# Patient Record
Sex: Female | Born: 1975 | Race: White | Hispanic: No
Health system: Western US, Academic
[De-identification: ages and names within clinical notes are randomized; demographics above are authoritative.]

## PROBLEM LIST (undated history)

## (undated) DIAGNOSIS — R0602 Shortness of breath: Secondary | ICD-10-CM

## (undated) DIAGNOSIS — J989 Respiratory disorder, unspecified: Secondary | ICD-10-CM

## (undated) DIAGNOSIS — K259 Gastric ulcer, unspecified as acute or chronic, without hemorrhage or perforation: Secondary | ICD-10-CM

## (undated) DIAGNOSIS — N289 Disorder of kidney and ureter, unspecified: Secondary | ICD-10-CM

## (undated) DIAGNOSIS — M1990 Unspecified osteoarthritis, unspecified site: Secondary | ICD-10-CM

## (undated) DIAGNOSIS — I10 Essential (primary) hypertension: Secondary | ICD-10-CM

## (undated) DIAGNOSIS — F32A Depression, unspecified: Secondary | ICD-10-CM

## (undated) DIAGNOSIS — J45909 Unspecified asthma, uncomplicated: Secondary | ICD-10-CM

## (undated) DIAGNOSIS — D649 Anemia, unspecified: Secondary | ICD-10-CM

## (undated) HISTORY — DX: Depression, unspecified: F32.A

## (undated) HISTORY — DX: Respiratory disorder, unspecified: J98.9

## (undated) HISTORY — DX: Essential (primary) hypertension: I10

## (undated) HISTORY — DX: Disorder of kidney and ureter, unspecified: N28.9

## (undated) HISTORY — DX: Unspecified asthma, uncomplicated: J45.909

## (undated) HISTORY — DX: Shortness of breath: R06.02

## (undated) HISTORY — DX: Anemia, unspecified: D64.9

## (undated) HISTORY — DX: Unspecified osteoarthritis, unspecified site: M19.90

## (undated) HISTORY — DX: Gastric ulcer, unspecified as acute or chronic, without hemorrhage or perforation: K25.9

---

## 2005-12-12 ENCOUNTER — Encounter: Payer: MEDICAID | Admitting: Endocrinology

## 2006-01-16 ENCOUNTER — Ambulatory Visit: Payer: MEDICAID

## 2006-02-13 ENCOUNTER — Encounter: Payer: MEDICAID | Admitting: Otolaryngology

## 2006-02-18 ENCOUNTER — Encounter (HOSPITAL_COMMUNITY): Payer: MEDICAID

## 2006-02-18 ENCOUNTER — Other Ambulatory Visit (HOSPITAL_BASED_OUTPATIENT_CLINIC_OR_DEPARTMENT_OTHER): Payer: Self-pay

## 2006-02-26 ENCOUNTER — Encounter: Payer: MEDICAID | Admitting: Otolaryngology

## 2006-02-27 LAB — PATHOLOGY, SURGICAL

## 2016-03-22 ENCOUNTER — Telehealth (INDEPENDENT_AMBULATORY_CARE_PROVIDER_SITE_OTHER): Payer: Self-pay | Admitting: Oral and Maxillofacial Surgery

## 2016-03-22 NOTE — Telephone Encounter (Signed)
Patient was warmly transferred requesting explanation why she is being referred to the [facility]. She looked at the web site of the aforementioned clinic and she has the impression that this clinic will have nothing to do with her request for dental extractions. Informed patient that this is a recommendation from our clinic upon review of her medical history. Informed patient that I will send a message to our clinic RN Manager to call her back  on Tuesday-03/26/16. Patient hang up with an unhappy note and stated that she will call back on Tuesday as well.

## 2016-05-16 ENCOUNTER — Telehealth (INDEPENDENT_AMBULATORY_CARE_PROVIDER_SITE_OTHER): Payer: Self-pay | Admitting: Oral & Maxillofacial Surgery

## 2016-05-16 NOTE — Telephone Encounter (Signed)
VM is full unable to leave message  Need to verify insurance if Medicare only we cannot see for extractions    Transfer patient to clinic    RedfieldMichelle  [phone]

## 2016-06-04 ENCOUNTER — Telehealth (INDEPENDENT_AMBULATORY_CARE_PROVIDER_SITE_OTHER): Payer: Self-pay | Admitting: Oral & Maxillofacial Surgery

## 2016-06-04 NOTE — Telephone Encounter (Signed)
(  TEXTING IS AN OPTION FOR [facility] ONLY)  Is this a [facility] clinic? No      RETURN CALL: Detailed message on voicemail only      SUBJECT:  General Message     REASON FOR REQUEST: returning call     MESSAGE: Patient is calling because she received a letter stating her appointment for 08/22 was cancelled and she would like to know why and get the appointment rescheduled as soon as possible. Please contact to discuss. Thank you!

## 2016-06-18 ENCOUNTER — Encounter (INDEPENDENT_AMBULATORY_CARE_PROVIDER_SITE_OTHER): Payer: Self-pay | Admitting: Oral & Maxillofacial Surgery

## 2016-08-27 ENCOUNTER — Encounter (INDEPENDENT_AMBULATORY_CARE_PROVIDER_SITE_OTHER): Payer: Self-pay | Admitting: Oral & Maxillofacial Surgery

## 2016-08-27 ENCOUNTER — Ambulatory Visit (INDEPENDENT_AMBULATORY_CARE_PROVIDER_SITE_OTHER): Payer: Medicare Other | Admitting: Oral & Maxillofacial Surgery

## 2016-08-27 VITALS — BP 118/74 | HR 79 | Temp 97.9°F | Resp 18 | Ht 66.0 in | Wt 145.0 lb

## 2016-08-27 DIAGNOSIS — Z6823 Body mass index (BMI) 23.0-23.9, adult: Secondary | ICD-10-CM

## 2016-08-27 DIAGNOSIS — K029 Dental caries, unspecified: Secondary | ICD-10-CM

## 2016-08-27 NOTE — Progress Notes (Addendum)
CC/HPI: 40 year old female with PMH significant for adrenal insufficiency due to surgical transsphenoidal pituitary adenomectomy and bilateral adrenalectomy, referred by Dr. Salvadore FarberAndrew Wightman DMD in [place], [place], presents for consultation regarding extraction of all remaining teeth. The patient reports constant pain associated with her dentition for the past 3 years. She reports that due to her condition, she has trouble brushing her teeth because they crumble when she tries to brush them. She reports that her teeth are brittle and break when she is eating soft food. Pt reports intermittent swelling and infection of her teeth. She desires to have the tooth removed to relieve her from dental pain.    PMH:   Past Medical History:   Diagnosis Date   . Anemia    . Arthritis    . Asthma    . Breath shortness    . Depression    . Gastric ulcer    . Hypertension    . Kidney disease    . 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 Pt elected to have bilateral adrenalectomy rather than repeat trans sphenoidal pituitary removal surgery.   Addison's Disease - under chronic corticosteroid supplemetnation  Asthma- last used emergency inhaler 6 months ago. Triggered by high stress. Never hospitalized  Borderline Anemia - no excessive bleeding noted during prior surgeries  Herniated Discs   Depression  Interstitial cystitis  Fibromyalgia  Hx of bronchitis      PSH:   Transsphenoidal pituitary adenomectomy (2006)  Bilateral adrenalectomy (2010)  Gall bladder removal (2013)  Microdiscectomy (2004)  Appendectomy (2005)  Wisdom teeth removal (as teenager)    MEDS:     Current Outpatient Prescriptions:   .  Fludrocortisone Acetate 0.1 MG Oral Tab, , Disp: , Rfl:   .  Hydrocortisone 20 MG Oral Tab, , Disp: , Rfl:   .  Ondansetron HCl 8 MG Oral Tab, , Disp: , Rfl:   .  OxyCODONE HCl ER 40 MG Oral Tablet Extended Release 12 hour  Abuse-Deterrent, , Disp: , Rfl:   .  Sertraline HCl 100 MG Oral Tab, , Disp: , Rfl:     ALLERGIES:   Review of patient's allergies indicates:  Allergies   Allergen Reactions   . Penicillins Hives     (at 40 yo)   . Tramadol Unknown     "Causes adverse w/drawal similar to opiates"   . Cefdinir      Other reaction(s): Diarrhea  Other reaction(s): Rash       SH:   Pt has 24 pack year history of tobacco usage. Has been using e-cig for past 3 years.  Denies ETOH/illicit drugs    FH:   Non-contributory    PE:   Vital Signs:   Vitals:    08/27/16 1352   BP: 118/74   BP Cuff Size: Regular   BP Site: Left Arm   BP Position: Sitting   Pulse: 79   Resp: 18   Temp: 97.9 F (36.6 C)   TempSrc: Temporal   SpO2: 98%   Weight: 145 lb (65.8 kg)   Height: 5\' 6"  (1.676 m)     GEN: Awake. alert, oriented, NAD  NEURO: Cns VII and V1, V2, V3 grossly intact  HEENT: NCAT. Face is symmetric without facial swelling. Mandibular opening is approximately 35-4840mm. FOM soft. Mallampati 1. OP clear. Tongue is mobile. Generalized gross decay. Multiple fractured teeth. Missing teeth #  1, 16, 17, 32. Space between tooth #12 and 14 with fragmented root tip #13 remaining not visualized on panoramic radiograph. No present abscess or sites of drainage.   CV: RRR  CHEST: CTAB    IMAGING: Panoramic radiograph shows carious remaining teeth including teeth # 2, 3, 4, 5, 6, 7, 8, 9, 10, 11, 12, 13, 14, 15, 18, 19, 20, 21, 22, 23, 24, 25, 26, 27, 28, 29, 30, 31. Missing teeth #1, 16, 17, 32.     ASSESSMENT: 40 year old female with PMH significant for adrenal insufficiency due to surgical transsphenoidal pituitary adenomectomy and bilateral adrenalectomy, referred by Dr. Salvadore FarberAndrew Wightman DMD in [place], [place], indicated for extraction of all remaining teeth to facilitate denture fabciraction.    PLAN:  Plan is to complete full mouth surgical extractions of 28 remaining teeth/ root tips including teeth # 2, 3, 4, 5, 6, 7, 8, 9, 10, 11, 12, 13, 14, 15, 18, 19, 20, 21,  22, 23, 24, 25, 26, 27, 28, 29, 30, 31. Types of anesthesia and sedation were discussed with patient, but patient has not yet made final decision for type of anesthesia.    Due to her chronic corticosteroid use, pt will need stress dose of her steroids.  If patient will have extractions done in clinic under IV sedation or local anesthesia, it would be appropriate to contact patient's endocrinologist for management recommendations.   Endocrinologist: ElenaVarlamovM.D. 503 [phone]    Details of the procedure and potential risks versus benefits of dental extraction were discussed. The patient was made aware of the risks involving but not limited to pain, swelling, infection, excessive bleeding, poor wound healing, nerve injury that can arise from administration of local anesthetics, injury to the adjacent teeth, oroantral communication and formation of periodontal defect. The patient elects to proceed with the extraction. Anesthesia options were discussed. The patient requested a summary of cost estimates for local anesthesia, IV sedation, and general anesthesia. Pt will be called back when cost estimates are available.  Questions were welcomed and answered in detail.       Pt was seen and evaluated by attending Dr. Laurell JosephsBurke, who agrees with this assessment and plan.    Perfecto KingdomLeena Zurayk, DDS  Resident, Oral and Maxillofacial Surgery    I have examined the patient, reviewed her history and findings.  I agree with Dr. Lollie SailsZurayk's note and plan.  We will follow up with the patient regarding her anesthesia options.  As mentioned above, should this be done in our outpatient clinic, and we will contact her endocrinologist for any recommendations regarding her corticosteroid use.    Jackquline BerlinAndrea B. Burke, DMD, MD  Attending, Oral & Maxillofacial Surgery

## 2016-09-09 ENCOUNTER — Telehealth (INDEPENDENT_AMBULATORY_CARE_PROVIDER_SITE_OTHER): Payer: Self-pay | Admitting: Oral & Maxillofacial Surgery

## 2016-09-09 NOTE — Telephone Encounter (Signed)
I have faxed the Estimated Hospital Fees to [phone] per patient request. Unfortunately, fax message did not go through. I left a message to call clinic back when to fax the document once again.

## 2016-09-09 NOTE — Telephone Encounter (Signed)
Refax requested document at 1350.

## 2016-09-09 NOTE — Telephone Encounter (Signed)
Returned pt call. Estimated Hospital Fees for dental extractions has been scanned to media. I have spoken to the patient over the phone. She and her mom requested the said document to be faxed over to [phone]. They will call the cashier's office to fulfill payment obligation for extractions of 28 teeth total. I have faxed same document to [employer](Cashier's Office) for him to know what this patient is calling to pay for. I have instructed the patient to sign the form and fax it to [employer] at [phone]. The patient can then call our office to set up an appointment after fulfillment of financial responsibility.

## 2016-09-09 NOTE — Telephone Encounter (Signed)
(  TEXTING IS AN OPTION FOR [facility] ONLY)  Is this a [facility] clinic? No      RETURN CALL: Detailed message on voicemail only      SUBJECT:  General Message     REASON FOR REQUEST: surgery    MESSAGE: patient called in to check to see if the template for surgery scheduling has been released yet. Stated she was told at her appt on 10/31 if she had not heard from clinic in a couple weeks to call and check. Wanting to get procedure scheduled to get the ball rolling on getting her dentures.

## 2016-09-09 NOTE — Telephone Encounter (Signed)
Can you please try resending the fax [phone].  Had to reboot the modem to get it working again.  Thank you.

## 2016-09-18 ENCOUNTER — Telehealth (INDEPENDENT_AMBULATORY_CARE_PROVIDER_SITE_OTHER): Payer: Self-pay | Admitting: Unknown Physician Specialty

## 2016-09-18 NOTE — Telephone Encounter (Signed)
Attempted to call pt's endocrinologist for recommendations on pre-op stress dosing for steroids before extraction appointment. Physician was paged by [facility], but was not able to talk to her.     Perfecto KingdomLeena Zurayk, DDS  Resident, Oral and Maxillofacial Surgery

## 2016-09-23 ENCOUNTER — Telehealth (INDEPENDENT_AMBULATORY_CARE_PROVIDER_SITE_OTHER): Payer: Self-pay

## 2016-09-23 ENCOUNTER — Telehealth (INDEPENDENT_AMBULATORY_CARE_PROVIDER_SITE_OTHER): Payer: Self-pay | Admitting: Oral & Maxillofacial Surgery

## 2016-09-23 NOTE — Telephone Encounter (Signed)
Requested Kimberly Gardner, Corie, to assist in estimate for IV sedation for her 28 extractions + root tip.

## 2016-09-23 NOTE — Telephone Encounter (Signed)
(  TEXTING IS AN OPTION FOR [facility] ONLY)  Is this a [facility] clinic? No      RETURN CALL: Detailed message on voicemail only      SUBJECT:  Appointment Request     REASON FOR REQUEST/SYMPTOMS: Surgery  REFERRING PROVIDER: NA  REQUEST APPOINTMENT WITH: NA  REQUESTED DATE: Any, TIME: After 9am  UNABLE TO APPOINT BECAUSE: SOP states to send TE for scheduling.

## 2016-09-24 NOTE — Telephone Encounter (Signed)
Left message for patient informing her that Medicare does not have dental coverage, if she wishes to proceed with full mouth extractions under IV Sedation a cost estimate will need to be submitted to our financial department and paid up front prior to having the procedure done. Patient to call Corie ([facility] for [facility] [phone]) back if there are further questions and wishes to proceed with cost estimate to have teeth pulled.

## 2016-09-27 ENCOUNTER — Telehealth (INDEPENDENT_AMBULATORY_CARE_PROVIDER_SITE_OTHER): Payer: Self-pay | Admitting: Unknown Physician Specialty

## 2016-09-27 NOTE — Telephone Encounter (Signed)
Contacted [facility] Endocrinologist Gweneth DimitriElena Varlamov M.D. [phone]    Recommendation   1. In the morning, the patient will double her dose of hydrocortisone.     2a. If she has a morning appointment, she should be given 50mg  IV hydrocortisone 15 minutes before her procedure.      2b. If afternoon appointment - give 100mg  hydrocortisone give IV just before the procedure.    3. When she goes home take, one additional home dose later in the day.     Perfecto KingdomLeena Zurayk, DDS  Resident, Oral and Maxillofacial Surgery

## 2016-10-02 ENCOUNTER — Telehealth (INDEPENDENT_AMBULATORY_CARE_PROVIDER_SITE_OTHER): Payer: Self-pay

## 2016-10-02 NOTE — Telephone Encounter (Signed)
IV sedation preop teaching  See orca   In additional instructions     Contacted [facility] Endocrinologist ElenaVarlamovM.D. 503 [phone]    Recommendation   1. In the morning, the patient will double her dose of hydrocortisone.     2a. If she has a morning appointment, she should be given 50mg  IV hydrocortisone 15 minutes before her procedure.      2b. If afternoon appointment - give 100mg  hydrocortisone give IV just before the procedure.    3. When she goes home take, one additional home dose later in the day.     Perfecto KingdomLeena Zurayk, DDS  Resident, Oral and Maxillofacial Surgery

## 2016-10-02 NOTE — Telephone Encounter (Signed)
email from mother    Ms. Melanee LeftAtherton,      I'm Kimberly Gardner's mother and I will be bringing her to this appointment.  I am a bit confused, per the attached estimate we paid for nitrous oxide/analgesia/ anxiolysis code [phone] for the extractions as we were quoted $30,000 for the IV sedation which we can't afford as [name]'s medicare medical insurdoes not cover this procedure . Can you either call me or email me to clarify?     I can be reached at [phone] (home) or [phone] (cell).      email to mother -forwarded mother's email to [facility] request a follow up call to mother    Hi [name],  I have forwarded your question to [employer]ie [employer] Business Office Supervisor Administrator (phone [phone]).I am not the person to discuss the financial part.  I asked Corie to contact you.  Thank you

## 2016-10-04 ENCOUNTER — Telehealth (INDEPENDENT_AMBULATORY_CARE_PROVIDER_SITE_OTHER): Payer: Self-pay | Admitting: Oral & Maxillofacial Surgery

## 2016-10-04 ENCOUNTER — Telehealth (INDEPENDENT_AMBULATORY_CARE_PROVIDER_SITE_OTHER): Payer: Self-pay

## 2016-10-04 NOTE — Telephone Encounter (Signed)
Phone Number: [phone]            Hi Alvino ChapelJo and Sharon,Patient call says, she is in pain.     she needs to speak with RN, please call her back.     She says she is in pain, Need to know what to do.      Patient states she didn't call about her pain

## 2016-10-04 NOTE — Telephone Encounter (Signed)
Patient states only has shortness of breath when going up flights of stairs, and states she has been smoking for 20 or more years and states it's not a change to her overall condition how she has been for duration of being a smoker.  No active shortness of breath at time of call.    Patient also states she is concerned that she has already paid for procedure and states paid for nitrous oxide/cheaper type of sedation and is allegedly told to be having IV-sedation for procedure on 12/12.  Patient states can't afford the more expensive IV-sedation option, and would like this addressed.    Repeat request.    Urgent per caller.  CCR sending message as high priority as caller states there is/may be high medical necessity or time sensitivity for response.  Please advise.  Thanks!    Please contact patient with an update as soon as possible.  Thanks!

## 2016-10-04 NOTE — Telephone Encounter (Signed)
(  TEXTING IS AN OPTION FOR [facility] ONLY)  Is this a [facility] clinic? No      RETURN CALL: Detailed message on voicemail only      SUBJECT:  General Message     REASON FOR REQUEST: Returning call    MESSAGE: Patient would like clarification on her upcoming procedure.Attempted warm transfer but the clinic staff is currently assisting other patients.Thank you.Thank you.

## 2016-10-04 NOTE — Telephone Encounter (Signed)
Spoke to attending and chief resident with concerns of patient reporting unable to walk up 2 flights of stairs without SOB. Discuss of oral sedation vs IV sedation. With IV sedation patient would need medical clearance from PCP with recent EKG(last EKG 2007) and fax copy EKG and letter of medical clearance to [phone]  I gave Olegario MessierKathy my phone number and she is going to give Nurse carol or mitch message to call me back    Left message on voicemail to call my direct number at [phone].for patient to call me back today to explain that she would need EKG and medical clearance from PCP in order to proceed with IV sedation. Can offer oral sedation with nitrous which would be safe and less expensive than IV sedation. Asked her to call me back before end of day. And also that I was waiting to to hear back from PCP

## 2016-10-04 NOTE — Telephone Encounter (Signed)
[facility] informing PCP no IV sedation no medical clearance needed-left message for Kimberly RegalCarol the nurse with [facility] and call back number [phone] if any questions regarding plan for procedure12/12/17 under LA/nitrous/oxygen only

## 2016-10-04 NOTE — Telephone Encounter (Signed)
Patient will be having FME under LA/Nitrous and NO IV sedation  I spoke to patient and she was under impression that she was only having LA/nitrous and not IV sedation.  We have canceled her appointment with PCP to get EKG.   Patient and I in agreement that this is safest route. I let her know we will still put in a Saline lock to get her IV hydrocortisone.  Also instructed her that she does not need to be NPO.      Patient has paid in full for 28 extractions under local/nitrous    Patient appreciative of return call and clarification

## 2016-10-08 ENCOUNTER — Ambulatory Visit (INDEPENDENT_AMBULATORY_CARE_PROVIDER_SITE_OTHER): Payer: Medicare Other | Admitting: Oral & Maxillofacial Surgery

## 2016-10-08 ENCOUNTER — Encounter (INDEPENDENT_AMBULATORY_CARE_PROVIDER_SITE_OTHER): Payer: Self-pay | Admitting: Oral & Maxillofacial Surgery

## 2016-10-08 VITALS — BP 123/90 | HR 64 | Temp 97.2°F | Resp 18 | Wt 149.0 lb

## 2016-10-08 DIAGNOSIS — K0889 Other specified disorders of teeth and supporting structures: Secondary | ICD-10-CM

## 2016-10-08 DIAGNOSIS — K029 Dental caries, unspecified: Secondary | ICD-10-CM

## 2016-10-08 MED ORDER — CHLORHEXIDINE GLUCONATE 0.12 % MT SOLN
15.0000 mL | Freq: Two times a day (BID) | OROMUCOSAL | 1 refills | Status: AC
Start: 2016-10-08 — End: ?

## 2016-10-08 MED ORDER — IBUPROFEN 800 MG OR TABS
800.0000 mg | ORAL_TABLET | Freq: Once | ORAL | Status: AC
Start: 2016-10-08 — End: 2016-10-08
  Administered 2016-10-08: 800 mg via ORAL

## 2016-10-08 MED ORDER — OXYCODONE HCL 5 MG OR TABS
5.0000 mg | ORAL_TABLET | Freq: Four times a day (QID) | ORAL | 0 refills | Status: AC | PRN
Start: 2016-10-08 — End: ?

## 2016-10-08 MED ORDER — CHLORHEXIDINE GLUCONATE 0.12 % MT SOLN
15.0000 mL | Freq: Once | OROMUCOSAL | Status: AC
Start: 2016-10-08 — End: 2016-10-09
  Administered 2016-10-09: 15 mL via ORAL

## 2016-10-08 MED ORDER — ACETAMINOPHEN 500 MG OR TABS
500.0000 mg | ORAL_TABLET | Freq: Four times a day (QID) | ORAL | 1 refills | Status: AC | PRN
Start: 2016-10-08 — End: ?

## 2016-10-08 MED ORDER — CLINDAMYCIN HCL 150 MG OR CAPS
300.0000 mg | ORAL_CAPSULE | Freq: Once | ORAL | Status: AC
Start: 2016-10-08 — End: 2016-10-09
  Administered 2016-10-09: 300 mg via ORAL

## 2016-10-08 MED ORDER — HYDROCORTISONE SOD SUCCINATE 100 MG IJ SOLR
50.0000 mg | Freq: Once | INTRAMUSCULAR | Status: AC
Start: 2016-10-08 — End: 2016-10-09
  Administered 2016-10-09: 50 mg via INTRAVENOUS

## 2016-10-08 MED ORDER — IBUPROFEN 600 MG OR TABS
600.0000 mg | ORAL_TABLET | Freq: Four times a day (QID) | ORAL | 1 refills | Status: AC | PRN
Start: 2016-10-08 — End: ?

## 2016-10-08 NOTE — Patient Instructions (Signed)
You have had a full mouth extraction.  Next dose of Ibuprofen at 6pm.Given Ibuprofen 800mg  at 1015am.  Patient Education     After a Tooth Extraction: Caring for Your Mouth  When you've had a tooth removed (extracted), you need to take care of your mouth. Doing certain things, even on the first day, may help you feel better and heal faster.  Control bleeding  To help control bleeding, bite firmly on the gauze placed by your dentist. The pressure helps to form a blood clot in the tooth socket. If you have a lot of bleeding, bite on a regular tea bag. The tannic acid in the tea aids in forming a blood clot. Bite on the gauze or the tea bag until the bleeding stops. Slight oozing of blood on the first day is normal.  Minimize pain  To lessen any pain, take prescribed medicine as directed. Don't drive while taking any pain medicine as you may feel drowsy. Ask your dentist if you may take over-the-counter medicine, if needed.  Reduce swelling  To reduce swelling, put an ice pack on your cheek near the extraction site. You can make an ice pack by putting ice in a plastic bag and wrapping it in a thin towel. Apply the ice pack to your cheek for 10 minutes. Then remove it for 5 minutes. Repeat as needed. You may see some bruising on your face. This is normal and will go away on its own.  Get enough rest  Limit activities for the first 24 hours after an extraction. Rest during the day and go to bed early. When lying down, raise (elevate) your head slightly.  Do's  Below are some things to do to help your mouth heal.   Do eat a diet of soft, healthy foods and snacks.Also drink plenty of liquids.   Do brush your teeth gently. Avoid brushing around the extraction. And don't use any toothpaste. Rinsing toothpaste from your mouth may dislodge the blood clot.   Do keep the extraction site clean. After 12 hours you may be able to gently rinse your mouth. Rinse 4 times a day with 1 teaspoon of salt in a glass of water. Check  with your dentist first.  Don'ts  Below are some things to avoid while you're healing.   Don't drink with a straw. Sucking on a straw may dislodge the blood clot.   Don't drink hot liquids. Hot liquids may increase swelling. Limit your alcohol use. Excessive use of alcohol may slow healing.   Don't smoke. Smoking may break down the blood clot. This can cause a painful tooth socket.  Caution: Rinse your mouth very gently. Otherwise the blood clot may be dislodged.     Call your dentist  Get in touch with your dentist if you have any of the following:   Pain becomes more severe the day after your extraction.   Bleeding becomes hard to control.   Swelling around the extraction site worsens.   Itching or rashes occur after you take medicine.   Date Last Reviewed: 03/28/2016   2000-2017 [employer] [address], [place], [place] [id number]. All rights reserved. This information is not intended as a substitute for professional medical care. Always follow your healthcare professional's instructions.

## 2016-10-08 NOTE — Progress Notes (Signed)
Time: 10:50 AM    Prior to Procedure:                 Allergy Profile :          is allergic to penicillins; amoxicillin; beef-derived products; gluten meal; lactose intolerance (gi); milk protein; tomato; tramadol; and cefdinir.                                                              Vitals: Blood pressure 123/90, pulse 64, temperature 97.2 F (36.2 C), temperature source Temporal, resp. rate 18, weight 149 lb (67.6 kg), SpO2 99 %.                                                                               Allergies? (heparin, latex, chlorhexidi):  Done                           Anticipated pain & anxiety addressed: Done                           Consent form signed: Done                            Is patient on anticoagulation/coagulpoathic?: NO                          Coag results last 7 days: (last PTT/INR): NO                          Applicable test results available: Done                          Hands washed (clarify if not witnessed): YES                          Time Out/Final Verification:                             Active Time Out (all present & focused)?:YES                             Correct patient using 2 identifiers: YES                             Correct procedure: Extraction of teeth #2, 3, 4,5,6,7,8,9,10,11,12,13,14,15,18,19,20,21,22,23,24,25,26,27,28,29,30,31                             Correct Site/Side Marked:  Done  Correct patient position:   Done                             Availability of special equip/meds/blood: NO                             All syringes labeled with contents:  Done    PT monitoring                                                                EKG:  NO                          O2 Saturation:   YES                            VS q15 min during procedure:  YES             CC/HPI: 40 year old female with PMH significant for adrenal insufficiency due to surgical transsphenoidal pituitary adenomectomy and bilateral adrenalectomy,  referred by Dr. Salvadore FarberAndrew Wightman DMD in [place], [place], presents for extraction of all remaining teeth. Pt reports daily hydrocortisone 30mg  dose, different from previously recorded 20mg  dose. Otherwise pt denies changes to her PMH. She reports she took 60mg  of hydrocortisone this morning and all other regularly prescribed medications.                    PE:   Vitals:    10/08/16 0931   BP: 123/90   BP Cuff Size: Regular   BP Site: Left Arm   BP Position: Sitting   Pulse: 64   Resp: 18   Temp: 97.2 F (36.2 C)   TempSrc: Temporal   SpO2: 99%   Weight: 149 lb (67.6 kg)     GEN: Awake. alert, oriented, NAD  NEURO: Cns VII and V1, V2, V3 grossly intact  HEENT: NCAT. Face is symmetric without facial swelling. MIO approximately 35-1040mm. FOM soft. Mallampati 1. OP clear. Tongue is mobile. Generalized gross decay. Multiple fractured teeth. Missing teeth #1, 16, 17, 32. Tooth #13 fractured at gingiva level. No active abscess or sites of drainage.   CV: RRR  CHEST: CTAB    ASSESSMENT: 40 year old female with PMH significant for adrenal insufficiency due to surgical transsphenoidal pituitary adenomectomy and bilateral adrenalectomy presents for extraction of all remaining teeth to facilitate denture fabiraction.    PLAN:  Plan is to complete full mouth surgical extractions of 28 remaining teeth/ root tips including teeth # 2, 3, 4, 5, 6, 7, 8, 9, 10, 11, 12, 13, 14, 15, 18, 19, 20, 21, 22, 23, 24, 25, 26, 27, 28, 29, 30, 31 under nitrous oxide and local anesthesia.   After discussing with endocrinologist,  Pt was instructed to take double her hydrocortisone dose morning of procedure, she was given 50mg  IV hydrocortisone before the procedure, and will take double her normal dose during the day after her procedure (see telephone correspondence for record)  Pre-operative diagnosis: Dental caries, non-restorable tooth #  2,3,4,5,6,7,8,9,10,11,12,13,14,15,18,19,20,21,22,23,24,25,26,27,28,29,30,31    Post-operative diagnosis: same    Procedures: Extraction of teeth #'s 2,3,4,5,6,7,8,9,10,11,12,13,14,15,18,19,20,21,22,23,24,25,26,27,28,29,30,31 under local anesthesia and nitrous oxide     Attending: Dr. Laurell Josephs    Surgeon:   Madilyn Fireman, DDS    Anesthesia: Local anesthesia     Medications: 50mg  hydrocortisone IV, 300mg  Clindamycin, Ibuprofen 800mg  pre-operatively    Findings: Carious tooth # 2,3,4,5,6,7,8,9,10,11,12,13,14,15,18,19,20,21,22,23,24,25,26,27,28,29,30,31    Estimated Blood Loss: <20cc    Fluids: none    Specimens: Extracted teeth for disposal    Hardwares/Drains: None    Complications: None    Indication: 40 year old female presents for tooth extraction. Potential risks of the surgery were explained to the patient and parent/guardian including but not limited to trigeminal nerve injury, pain, swelling, excessive bleeding, infection, dry socket, medication risks, oral-antral communication, injury to adjacent teeth and restorations, and formation of periodontal defects. YES    Questions were invited and answered.  Written informed consent was obtained.     Procedure in detail:  3L of 100% oxygen was delivered for before titrating Nitrous to 50% and O2 50% with 6L flow rate.    Local anesthesia was achieved with 6 carpule(s) x 1.7cc of 2% Lidocaine with 1:100,000 epinephrine and 2 carpules x 1.7cc of 0.5% marcaine with 1:200k epinephrine.     A throat pack was used throughout the procedure, as was a bite block for mandibular stabilization. Pt was monitored with O2 VS q15 min during procedure:   YES    Attention was turned to maxillary right and left quadrant. #15 blade was used to make a buccal sulcular incision along teeth #2-15. Buccal flap was raised. Teeth #2-15 were delivered using periosteal elevator, small and large elevators, and forceps. Notable buccal plate fracture associated with extraction of tooth #3  and #4. All sockets were curetted. Alveoloplasty completed with rongeur and bone file. Irrigated maxillary right and left quadrant with copious amounts of saline. Closed with 3-0 running-locking sutures.     Attention was turned to maxillary right and left quadrant. #15 blade was used to make a buccal sulcular incision along teeth #18-31. Buccal flap was raised. Teeth #18, 20-31 were delivered using periosteal elevator, small and large elevators, and forceps. Tooth #19 was sectioned with handpiece and delivered in two segments. All sockets were curetted. Alveoloplasty completed with rongeur and bone file. Irrigated maxillary right and left quadrant with copious amounts of saline. Closed with 3-0 running suture and secured with 3 interrupted sutures in anterior and bilateral posterior quadrants. Pt was hemostatic at the end of the procedure.    3L flow of 100% oxygen was delivered for 5 minutes at the end of the procedure.     Gauze and pressure were applied for hemostasis.  YES    The patient tolerated the procedure well.    Post-operative instructions: Given:  verbal and written statements.    Post-operative medications:     Orders Placed This Encounter   Medications   . hydrocortisone 100 mg/vial injection     Order Specific Question:   Who is administering this medication?     Answer:   MA/RN   . clindamycin 150 mg capsule     Order Specific Question:   Who is administering this medication?     Answer:   MA/RN   . ibuprofen 800 mg tablet     Order Specific Question:   Who is administering this medication?     Answer:  MA/RN   . chlorhexidine 0.12% oral topical solution     Order Specific Question:   Who is administering this medication?     Answer:   MA/RN   . OxyCODONE HCl 5 MG Oral Tab     Sig: Take 1 tablet (5 mg) by mouth every 6 hours as needed for pain. For Pain.     Dispense:  28 tablet     Refill:  0     Order Specific Question:   Exempt from [facility] Opioid Policy?     Answer:   No   . Acetaminophen 500 MG  Oral Tab     Sig: Take 1 tablet (500 mg) by mouth every 6 hours as needed. For Pain.     Dispense:  50 tablet     Refill:  1   . Ibuprofen 600 MG Oral Tab     Sig: Take 1 tablet (600 mg) by mouth every 6 hours as needed. For pain/inflammation. Take with food.     Dispense:  30 tablet     Refill:  1   . Chlorhexidine Gluconate 0.12 % Mouth/Throat Solution     Sig: Swish and Spit 15 mL 2 times a day. Rinse mouth with 15 mL (marked on dosage cup) for 30 seconds, then spit out.     Dispense:  1 bottle     Refill:  1       Post-operative follow-up: PRN    Pt was seen and evaluated by attending Dr. Laurell JosephsBurke, who agrees with this assessment and plan.    Perfecto KingdomLeena Zurayk, DDS  Resident, Oral and Maxillofacial Surgery

## 2016-10-09 ENCOUNTER — Telehealth (INDEPENDENT_AMBULATORY_CARE_PROVIDER_SITE_OTHER): Payer: Self-pay

## 2016-10-09 NOTE — Telephone Encounter (Signed)
Spoke to mother Aldean JewettMillie who says patient is doing well today. Taking ibuprofen and tylenol around the clockHas done one salt water rinses so much-no issues or questions at this time. Encouraged to call me if any questionsor concerns. Mother agreed to plan and appreciative of f/u call

## 2016-10-11 NOTE — Progress Notes (Signed)
I have examined the patient, reviewed her history and findings.  I agree with Dr. Lollie SailsZurayk's note and the documented procedure.  I was immediately available for the duration of the procedure, and agree with the plan.    Jackquline BerlinAndrea B. Yasmeen Manka, DMD, MD  Attending, Oral & Maxillofacial Surgery

## 2018-09-03 IMAGING — CT CT ABDOMEN PELVIS W CONTRAST
3 of 4 series · 13 of 32 positions shown, 18 images · IV contrast (ISOVUE 370)
Comparison: 06/16/2018.

CT ABDOMEN AND PELVIS WITH CONTRAST
HISTORY: abdominal pains nauzsea vomiting diarrea 2 months
TECHNIQUE: A CT examination of the abdomen and pelvis was performed utilizing axial images from the lung bases to the pubic symphysis after the administration of intravenous contrast. Delayed images were performed through the pelvis. Multiplanar reformatted images were performed and reviewed.
LOCATION CODE: 1

[Series 2: abdomen · axial · 0.78mm/px · z∈[-368,-50]mm · 7 of 171 slices shown, 12 images]
[im 22/171  soft-tissue]
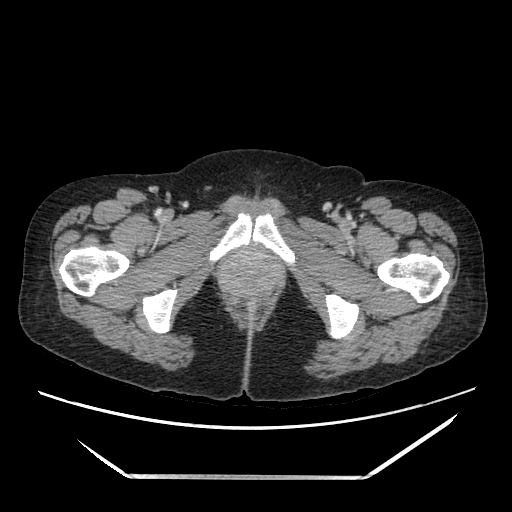
[im 22/171  bone]
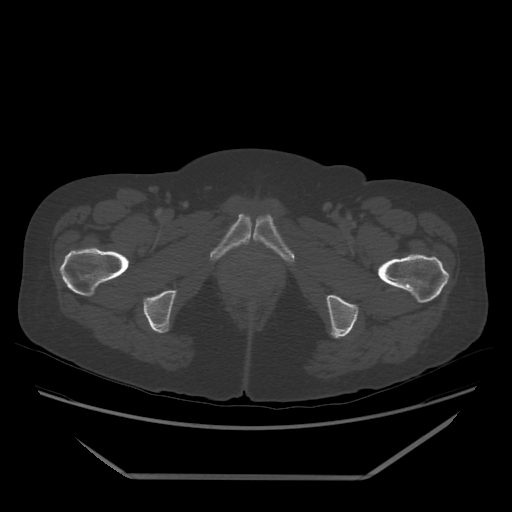
[im 43/171  soft-tissue]
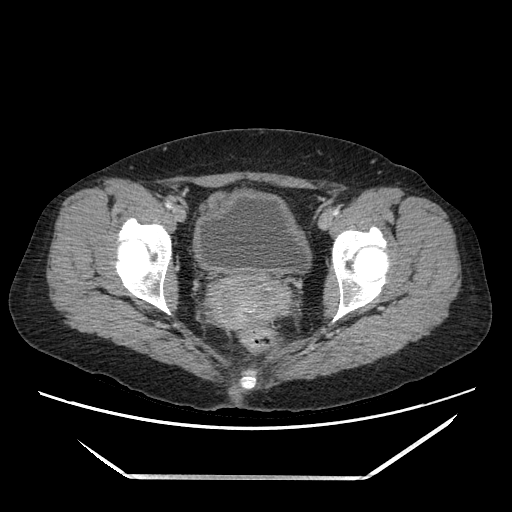
[im 64/171  soft-tissue]
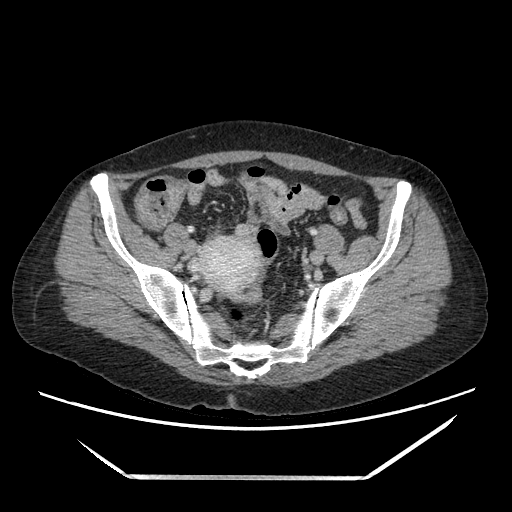
[im 86/171  soft-tissue]
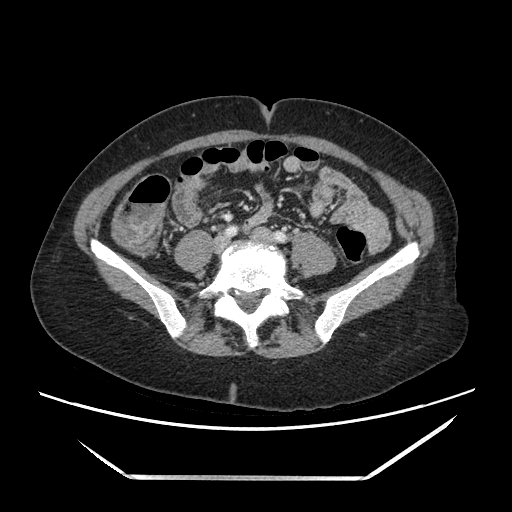
[im 86/171  lung]
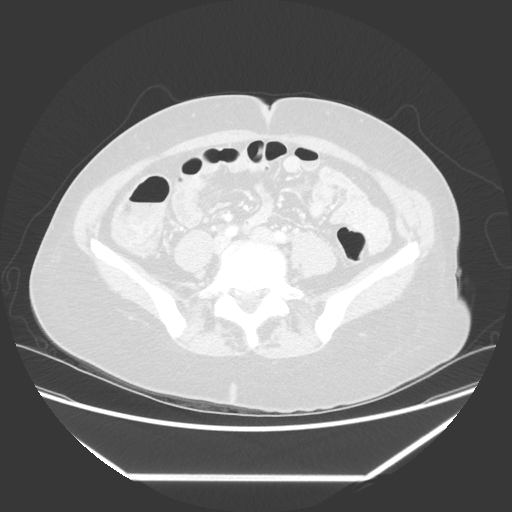
[im 107/171  soft-tissue]
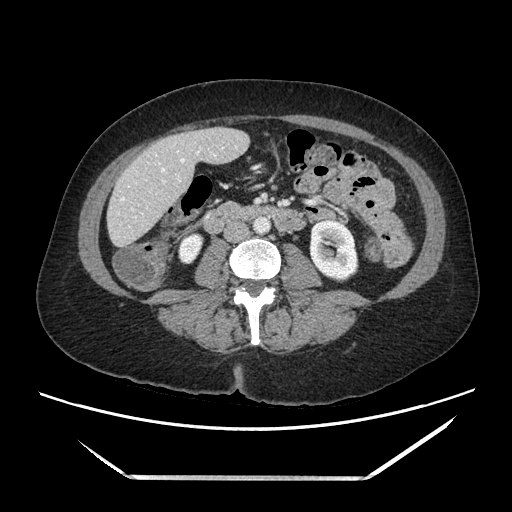
[im 107/171  lung]
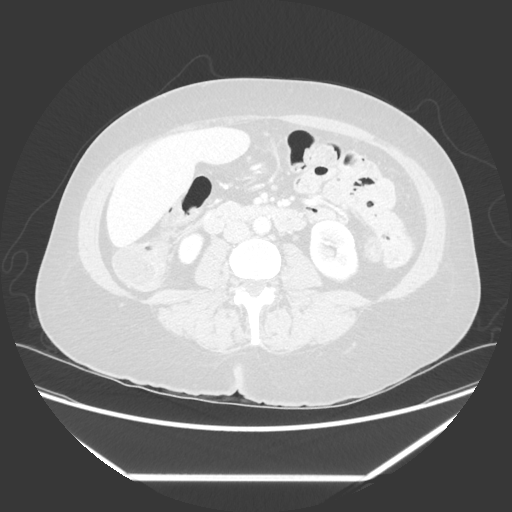
[im 128/171  soft-tissue]
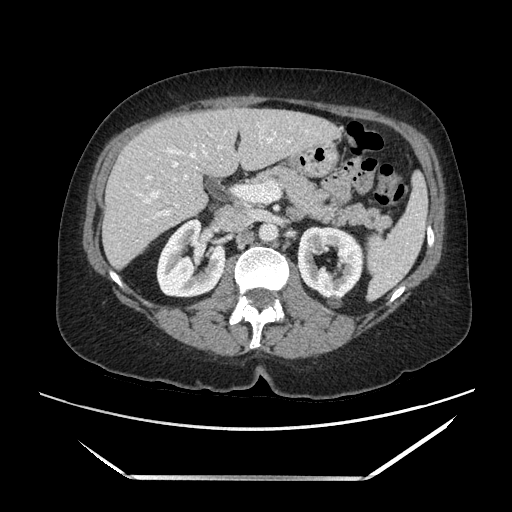
[im 128/171  lung]
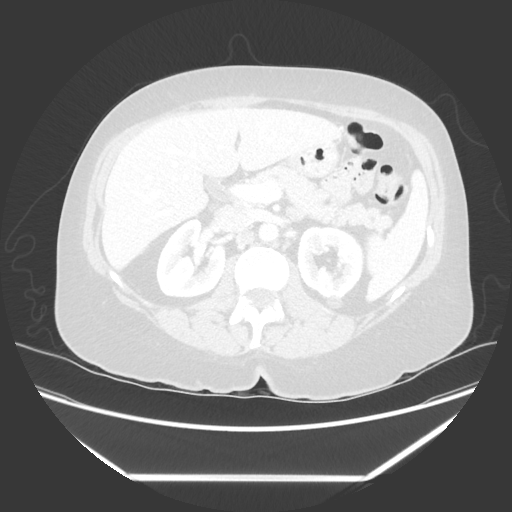
[im 149/171  soft-tissue]
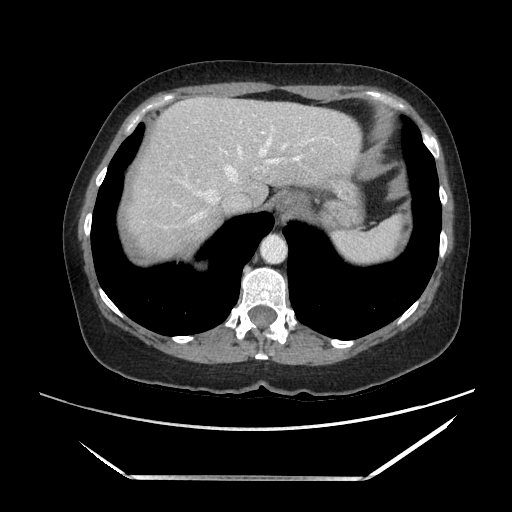
[im 149/171  lung]
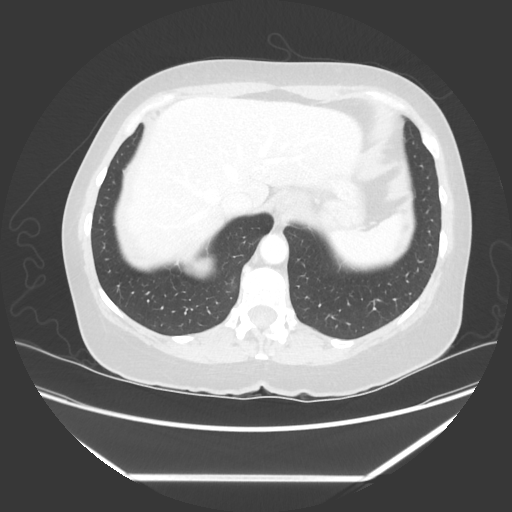

[Series 3: delay · axial · delayed · 0.79mm/px · z∈[-366,-253]mm · 3 of 91 slices shown]
[im 23/91  soft-tissue]
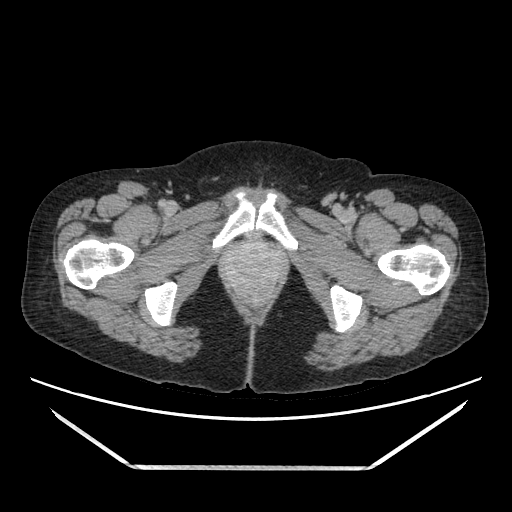
[im 46/91  soft-tissue]
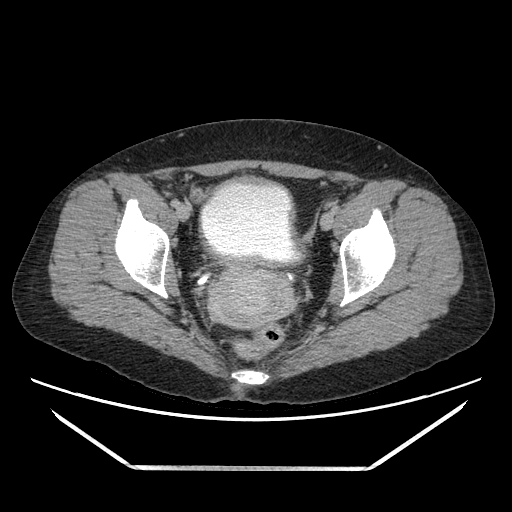
[im 68/91  soft-tissue]
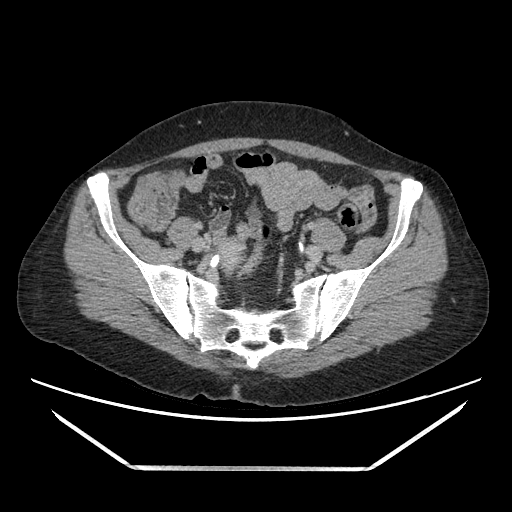

[Series 601: sagittal · sagittal · 0.83mm/px · 3 of 192 slices shown]
[im 22/192  soft-tissue]
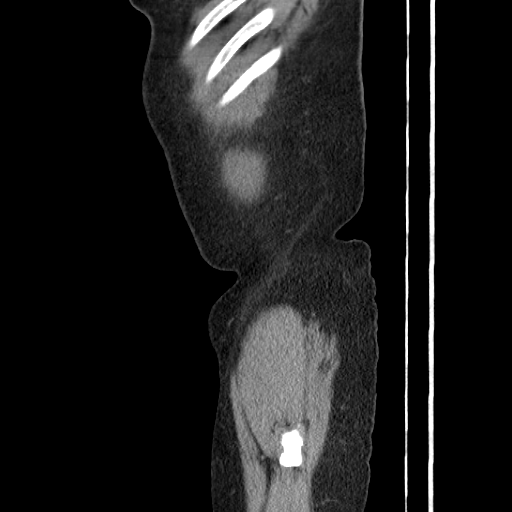
[im 43/192  soft-tissue]
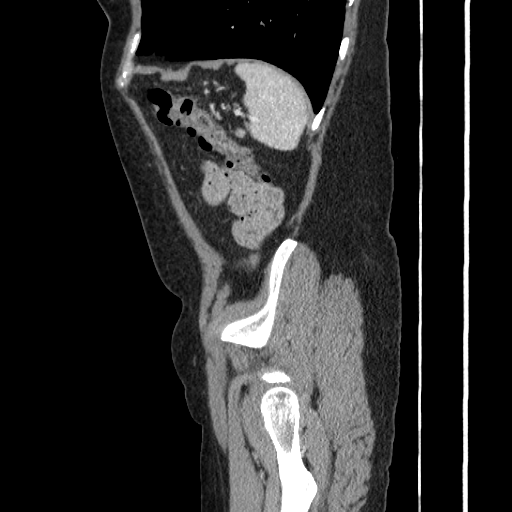
[im 64/192  soft-tissue]
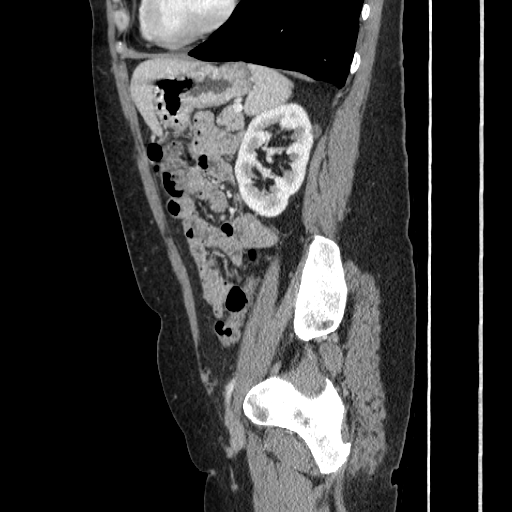

[13 of 32 positions shown; findings below may reference images not displayed]

FINDINGS: There is a left lower lobe 5 mm hypodense lesion in right hepatic lobe 3 mm hypodense lesion, too small to characterize, statistically benign/cyst. The gallbladder surgically absent. There is mild intrahepatic and extra hepatic biliary ductal dilatation, presumably reflecting postsurgical biliary ectasia. The pancreas and spleen are unremarkable. The bilateral adrenal glands are surgically absent.
The bilateral kidneys are normal in size demonstrating symmetric enhancement. There are bilateral renal hypodense lesions measuring up to 1.6 cm arising from the left renal inferior pole, likely reflecting cysts. There is a right renal 2 mm nonobstructive calculus. There is no obvious obstructive uropathy.
There is mild diffuse urinary bladder wall thickening that may reflect cystitis. The anteverted uterus is normal in size. There are bilateral tubal occlusion devices in place. There is no discrete adnexal mass lesion identified. There is no free fluid within the pelvis.
There is moderate segment ascending colon and cecal mild to moderate circumferential wall thickening with mild mucosal hyperemia, likely reflecting colitis. There is no significant pericolonic inflammatory changes. There is no bowel obstruction. There is no ascites or free intraperitoneal air. There is no obvious abdominal pelvic lymphadenopathy.
The visualized lungs are clear. The abdominal aorta is normal in size. There is lower lumbar spondylosis.
IMPRESSION: Mild to moderate right colitis, likely infectious versus inflammatory in etiology
Mild diffuse urinary bladder wall thickening that may reflect cystitis. Please correlate with urinalysis.
Is the patient pregnant?
No

## 2019-04-20 IMAGING — CR XR CHEST 1 VIEW
1 series · 1 of 1 positions shown · non-contrast
Comparison: none

Shortness of breath, hx of bronchitis and pneumonia
EXAM: CHEST SINGLE VIEW FRONTAL
Clinial Historycough, shortness of breath

[AP]
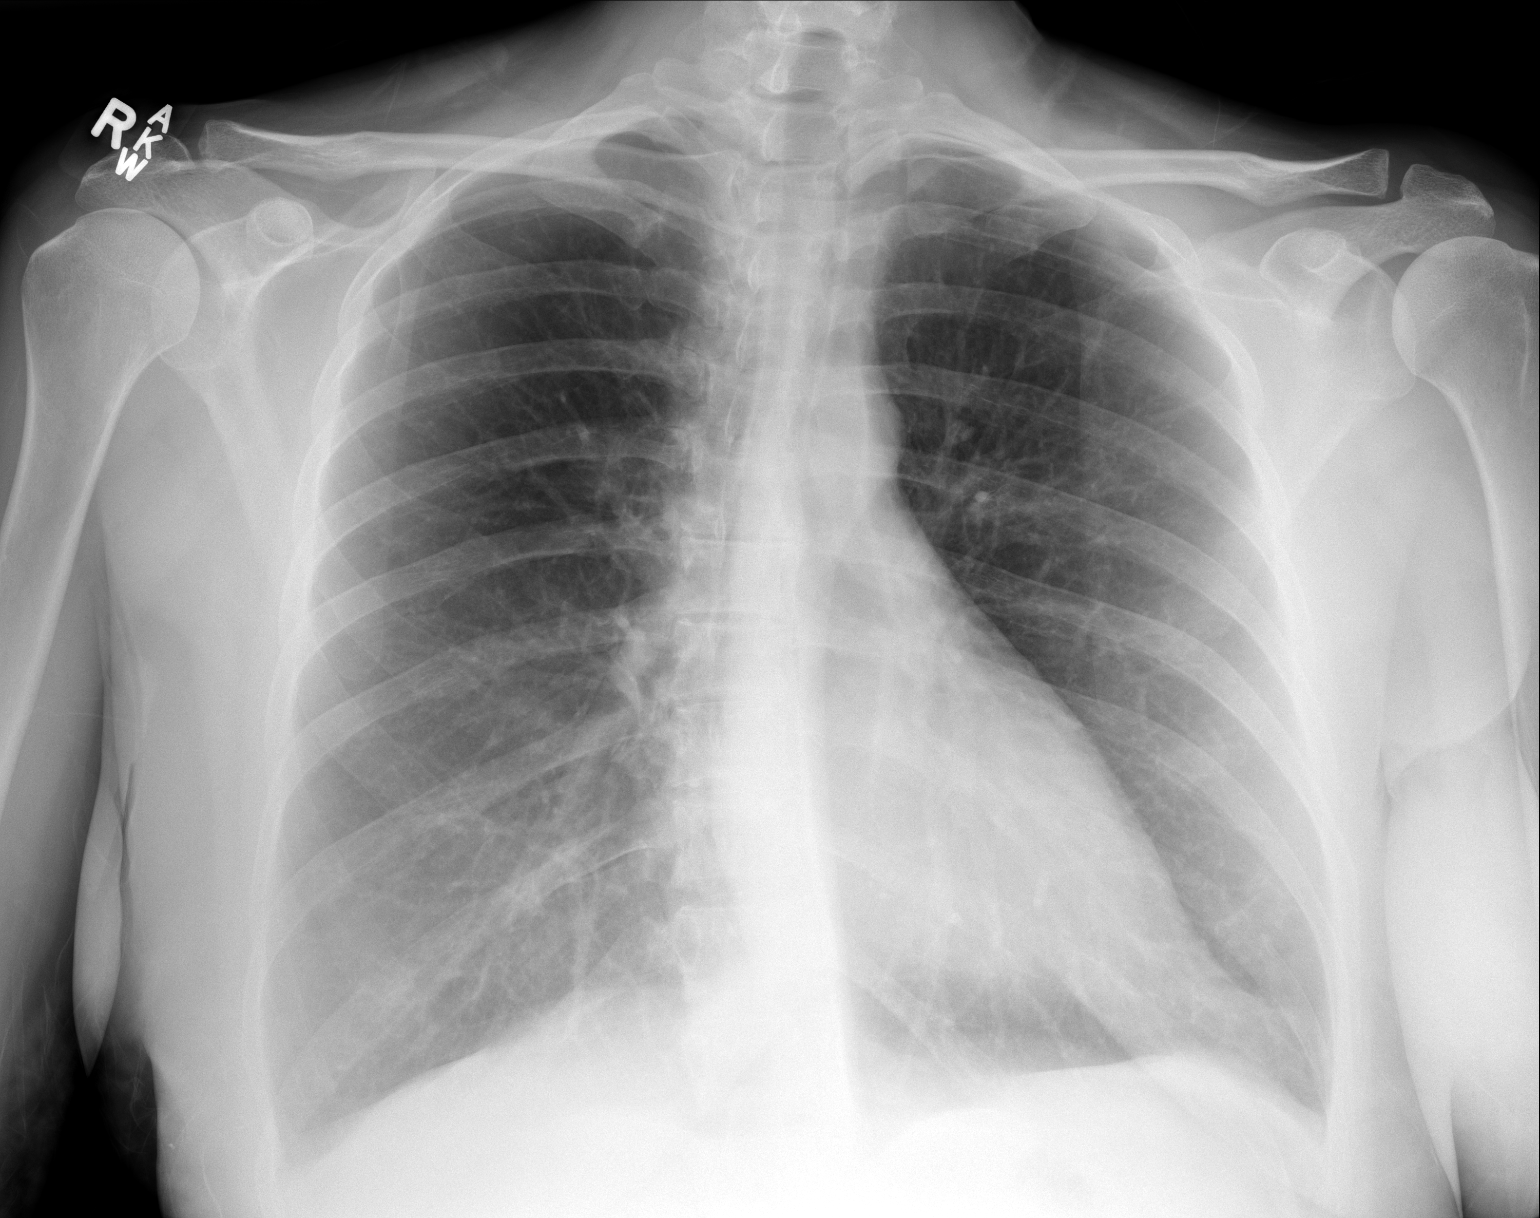

[1 of 1 positions shown; findings below may reference images not displayed]

FINDINGS: No prior imaging is available for comparison. The lungs are adequately expanded and clear.  The heart, mediastinum, hilar markings, and pleural spaces are grossly normal.  Bony structures are grossly intact.
IMPRESSION: 
IMPRESSION: No radiographic evidence for active chest disease
RocationW
Is the patient pregnant?
No
Portable?
Yes

## 2020-02-25 IMAGING — US US ABDOMEN COMPLETE
1 series · 14 of 25 positions shown · non-contrast
Comparison: None available.

COMPLETE ABDOMINAL ULTRASOUND:
CLINICAL INDICATION: r/o pancreatic cysts. Elevated liver function tests.
TECHNIQUE: Transabdominal ultrasound was performed of the abdominal viscera.

[Series 1: us abdomen complete · 14 of 52 slices shown]
[im 1/52]
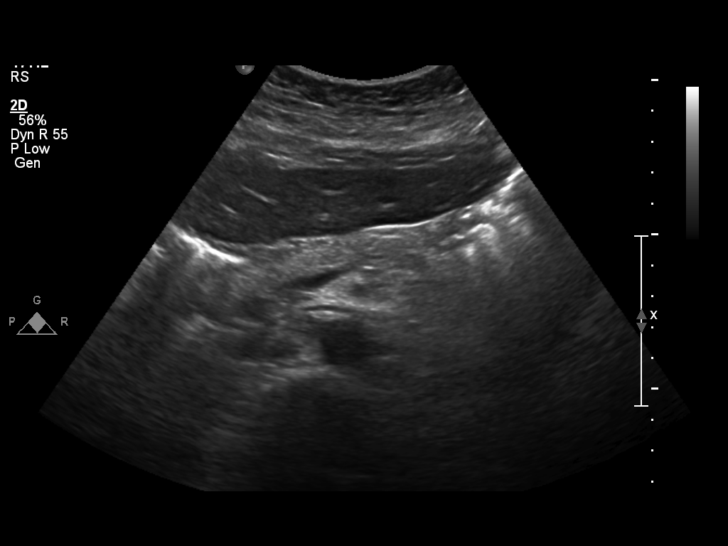
[im 5/52]
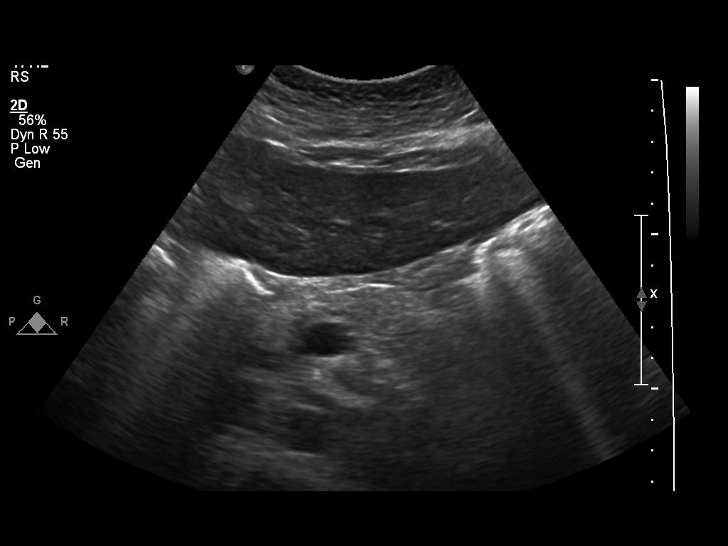
[im 9/52]
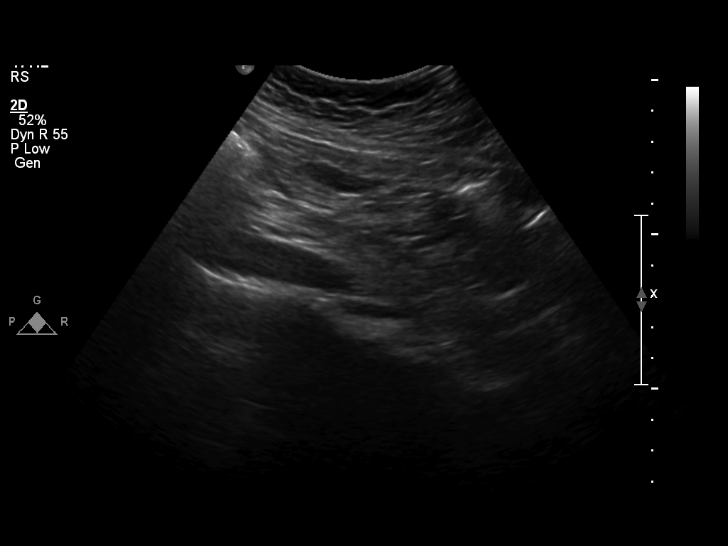
[im 13/52]
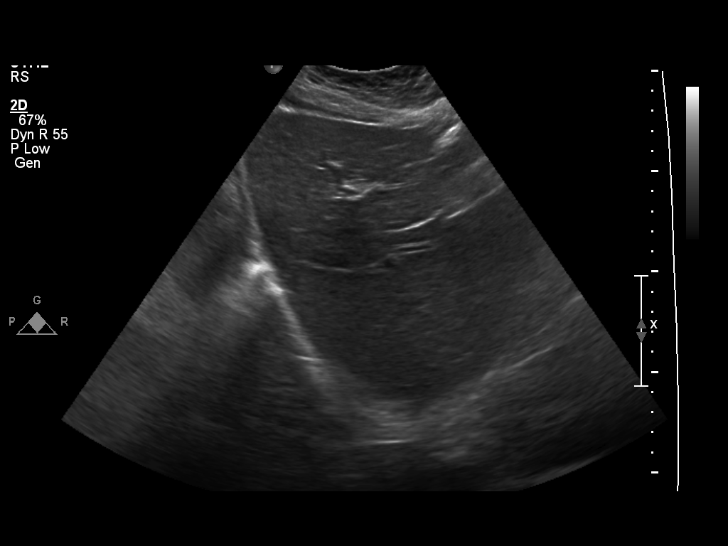
[im 18/52]
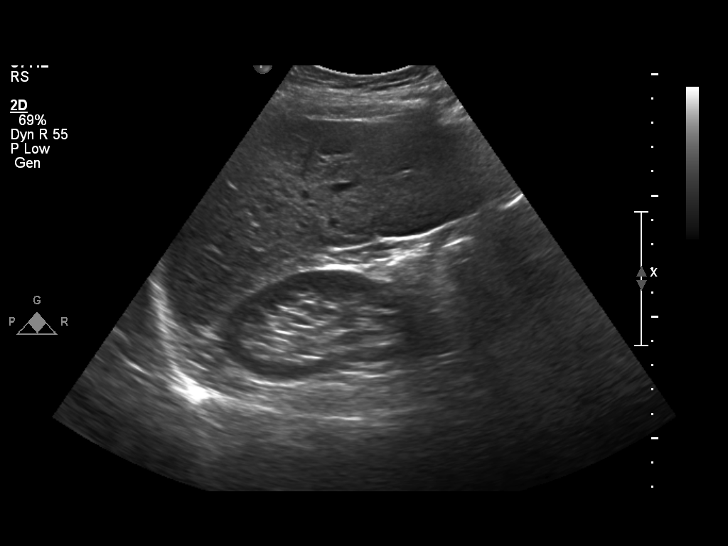
[im 20/52]
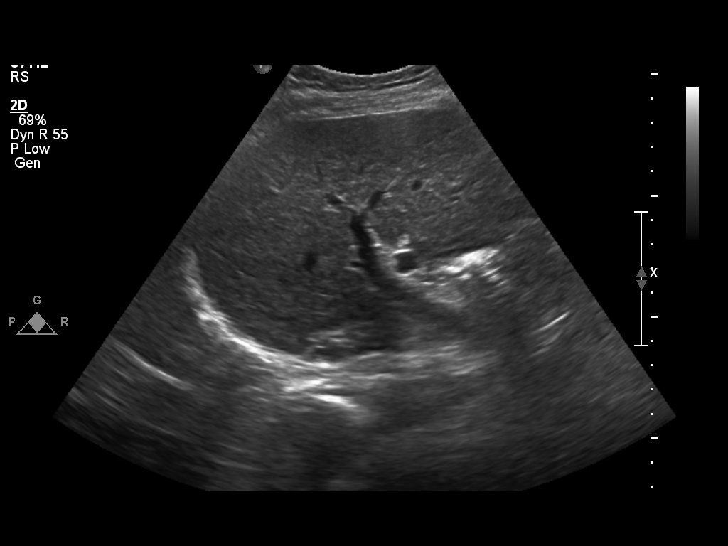
[im 24/52]
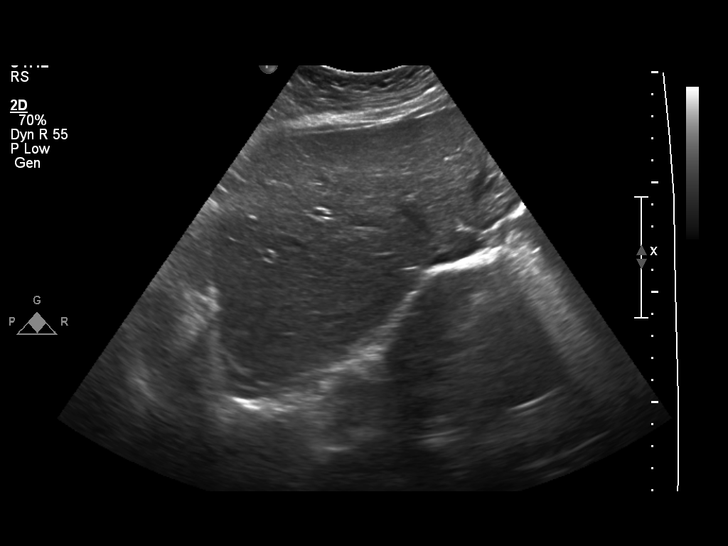
[im 28/52]
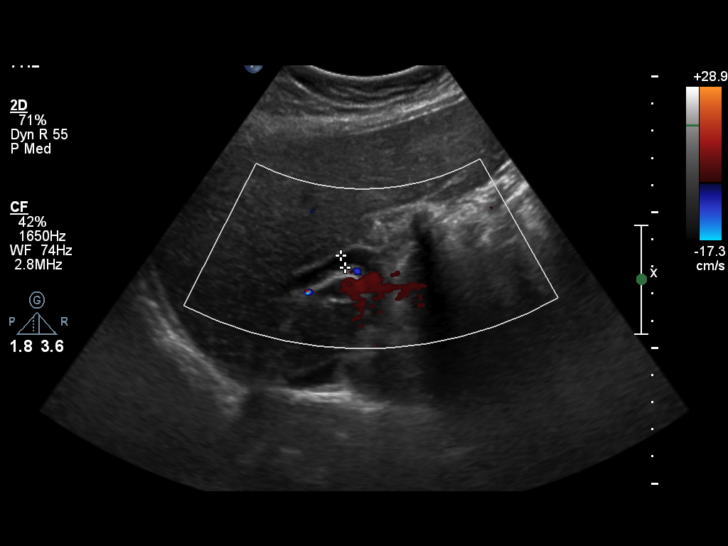
[im 32/52]
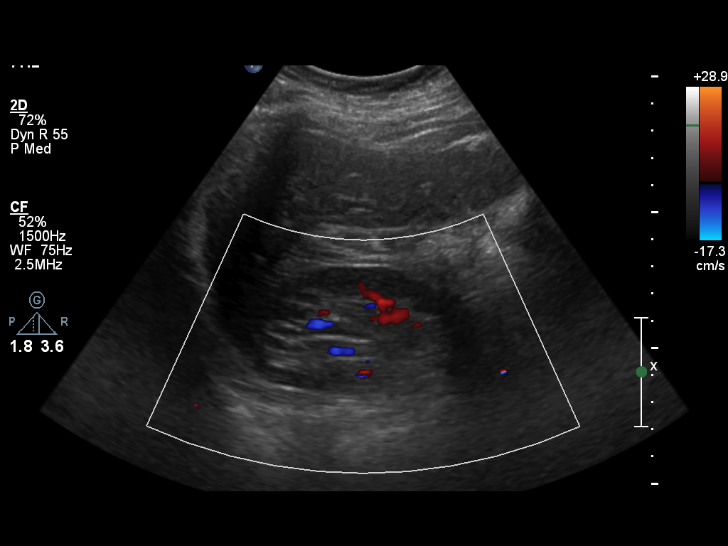
[im 35/52]
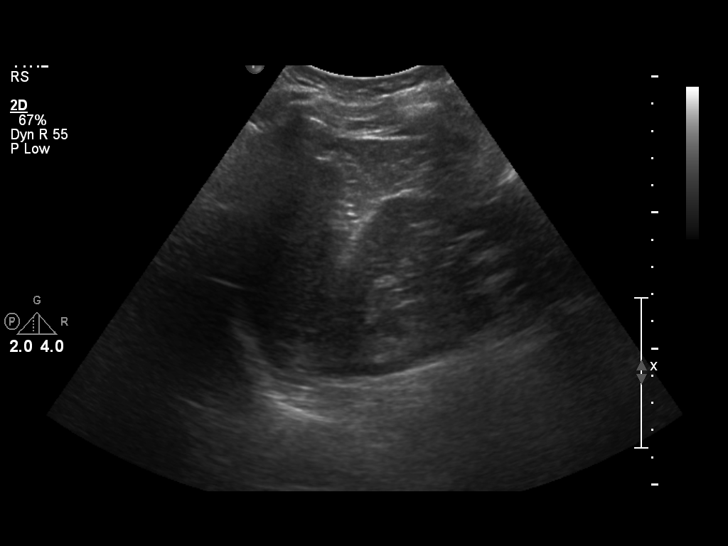
[im 39/52]
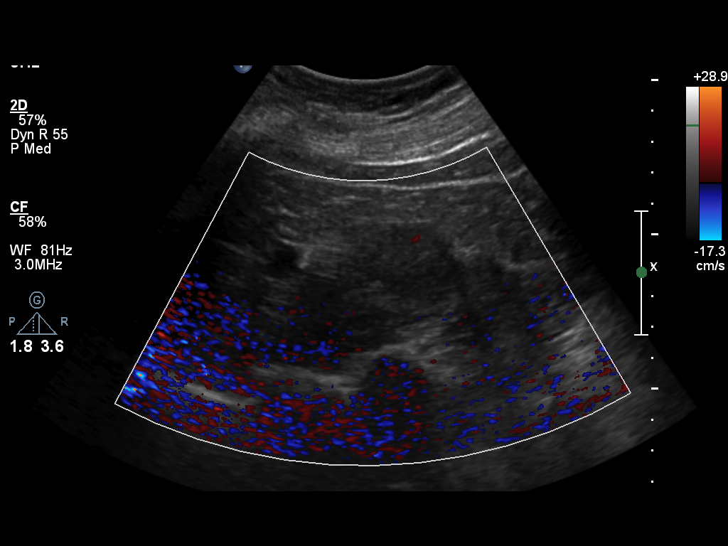
[im 43/52]
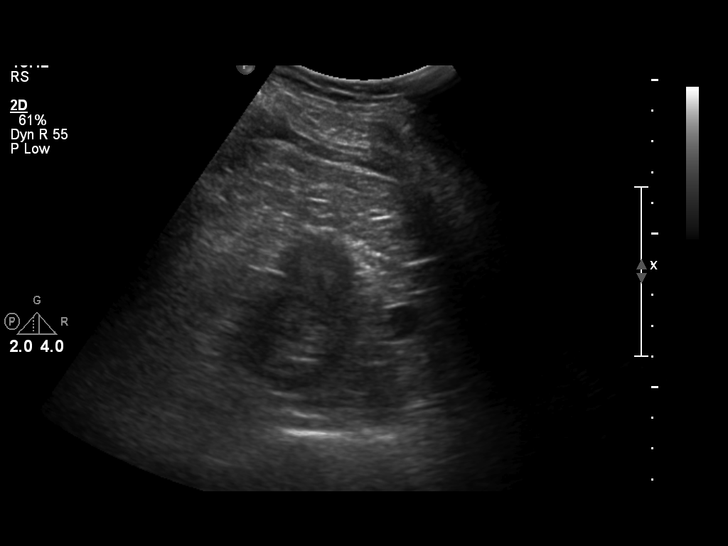
[im 47/52]
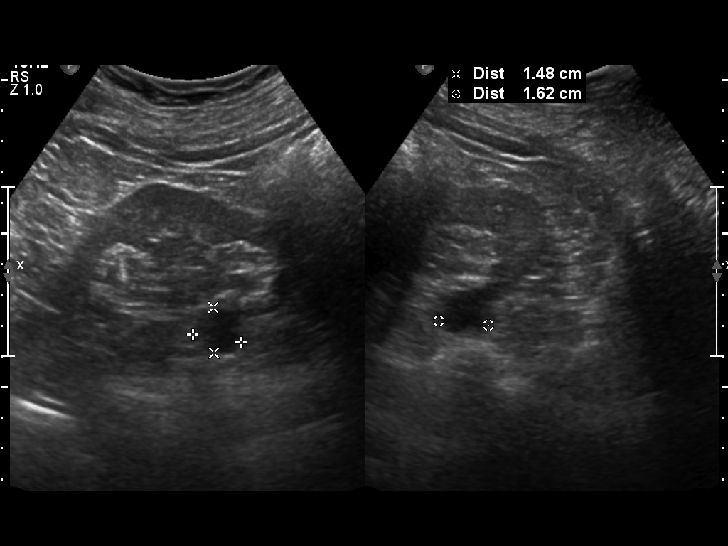
[im 52/52]
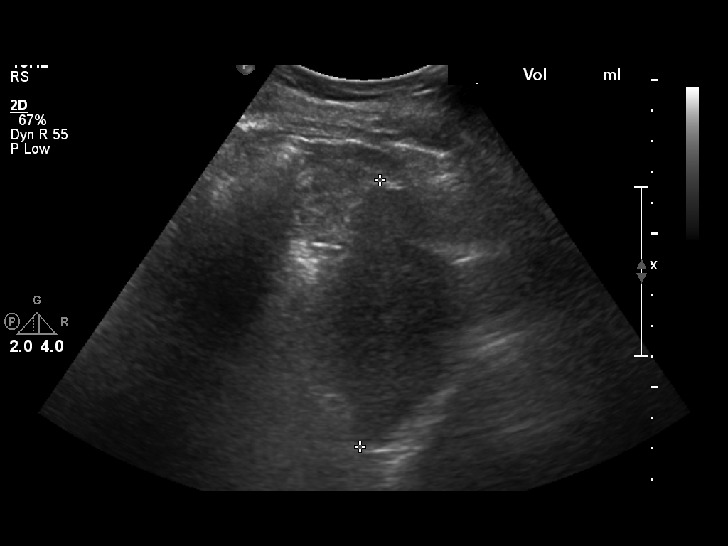

[14 of 25 positions shown; findings below may reference images not displayed]

FINDINGS: LIVER: No focal lesions. ; Normal hepatic echotexture identified.
BILIARY: Gallbladder is surgically absent.  Common bile duct measures 6.5 mm.
PANCREAS:  Proximal visualized portions of the pancreas are unremarkable.
SPLEEN: The spleen contains no focal lesions. ; measures: 7.0 x 2.9 cm.
PERITONEUM:  No free fluid.
KIDNEYS: Normal size bilaterally.  No hydronephrosis or solid mass lesion.;  The right kidney measures:  8.7 x 5.6 x 4.9 cm ; the left kidney measures:  8.5 x 4.1 x 5.3 cm. Multiple left-sided renal cysts with the largest measuring 1.6 x 1.5 x 1.6 cm.
MIDLINE VASCULATURE (IVC and Aorta):  Unremarkable in appearance.
IMPRESSION: 1.  Left renal cyst.
2.  Postcholecystectomy.
LOCATION CODE : 1

## 2021-06-05 IMAGING — US US PELVIS TRANSVAGINAL
1 series · 14 of 25 positions shown · non-contrast
Comparison: none

[Series 1: us pelvis transvaginal · 14 of 29 slices shown]
[im 1/29]
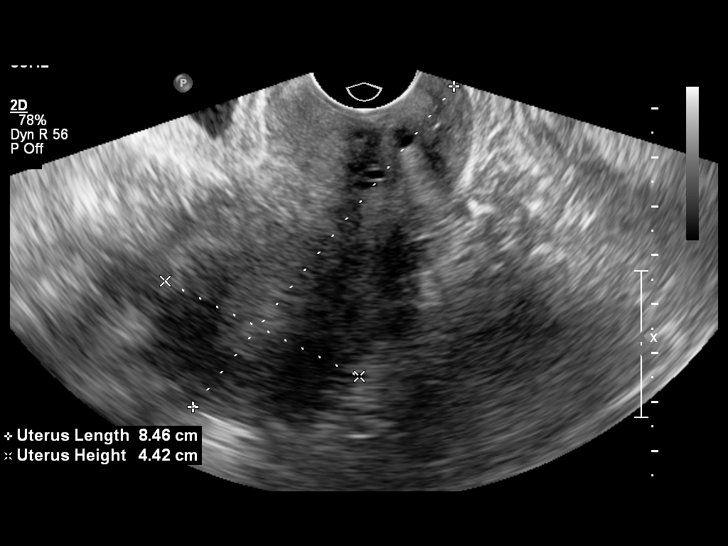
[im 3/29]
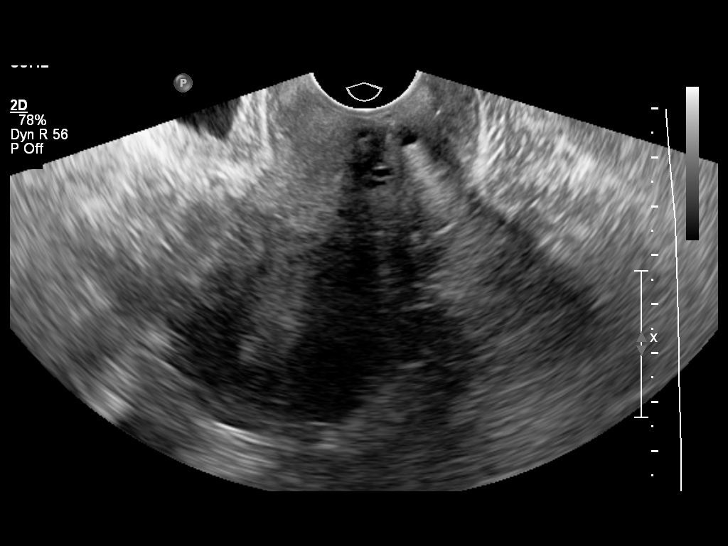
[im 5/29]
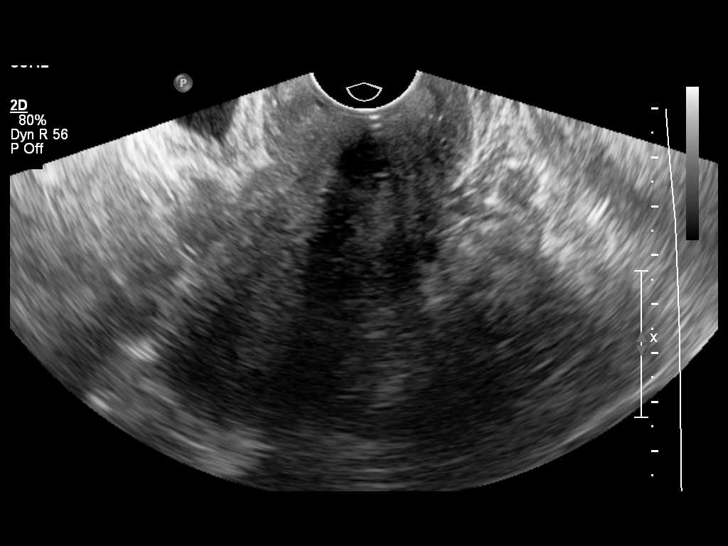
[im 8/29]
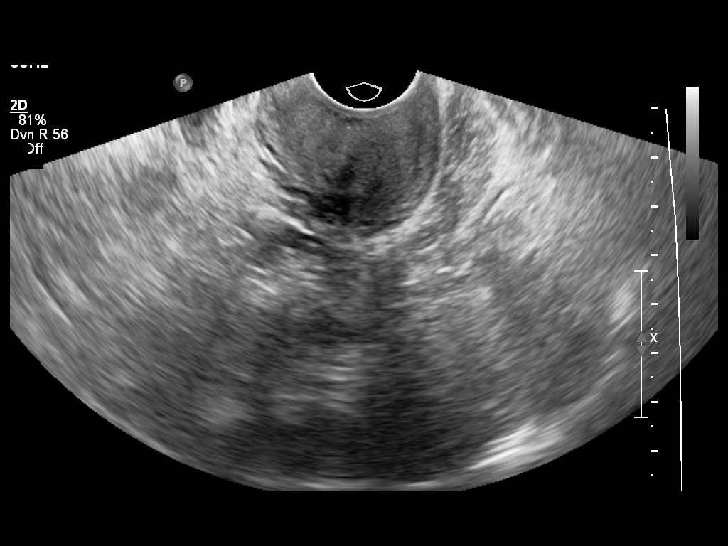
[im 10/29]
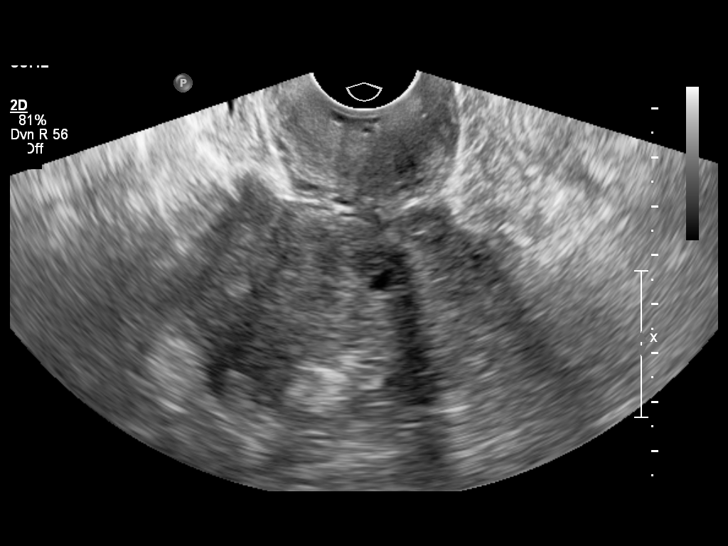
[im 11/29]
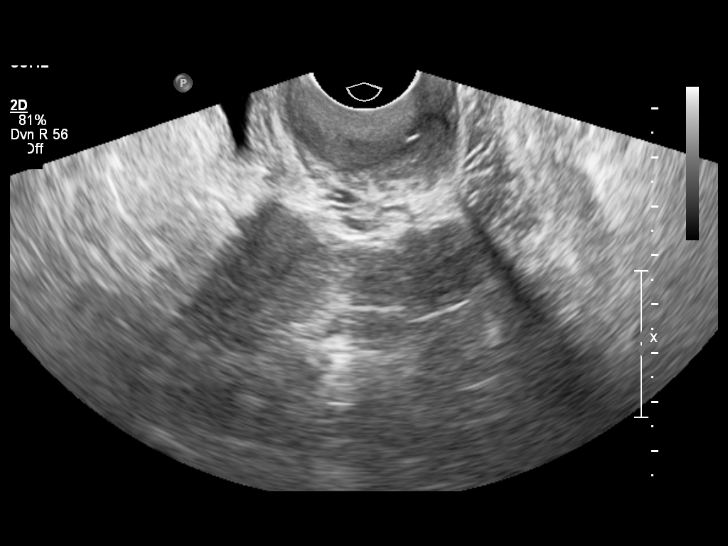
[im 13/29]
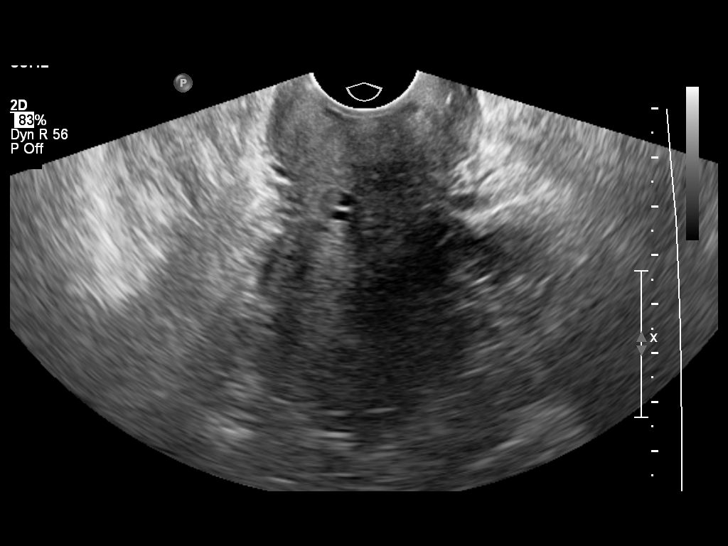
[im 16/29]
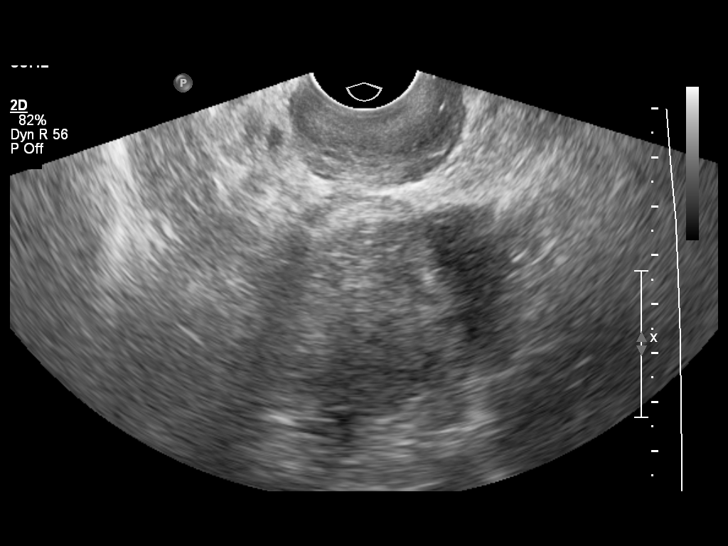
[im 18/29]
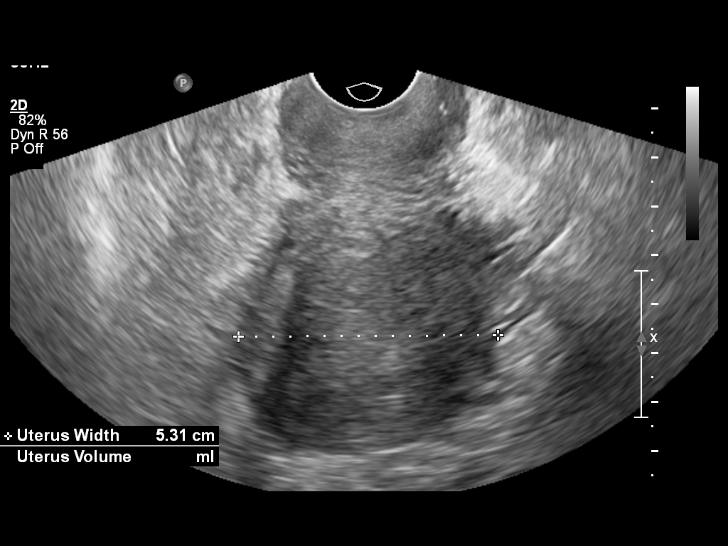
[im 19/29]
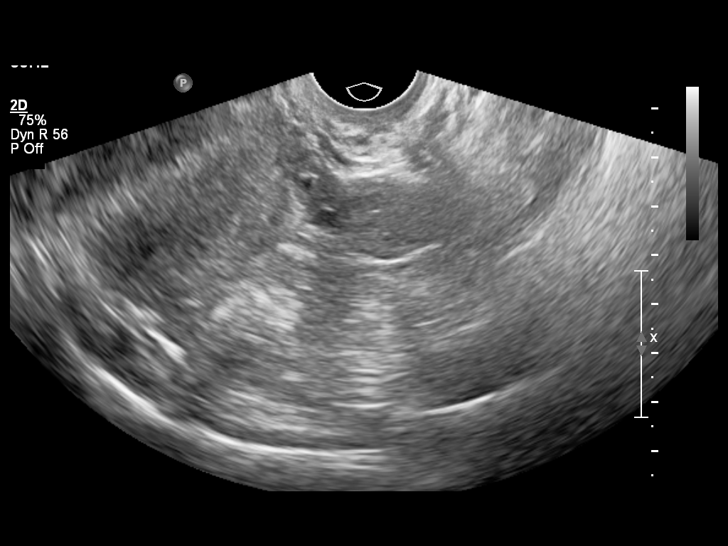
[im 22/29]
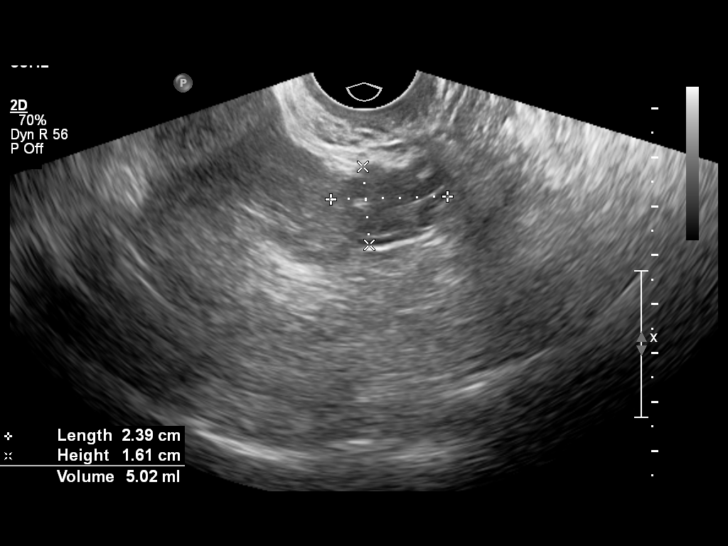
[im 24/29]
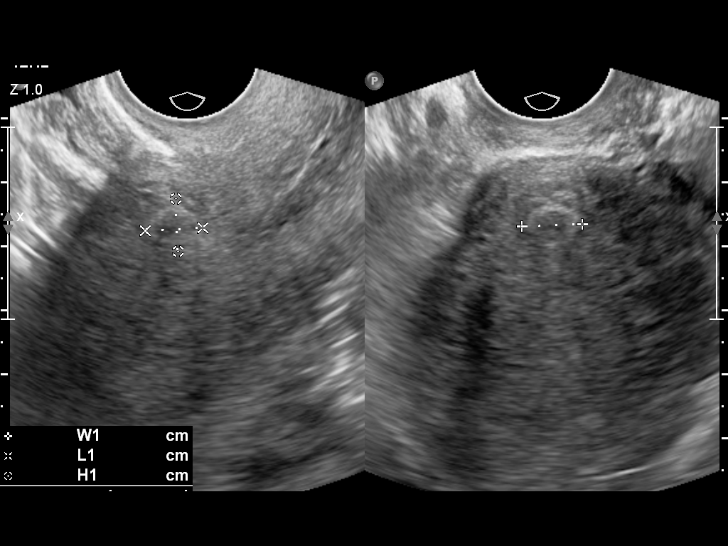
[im 26/29]
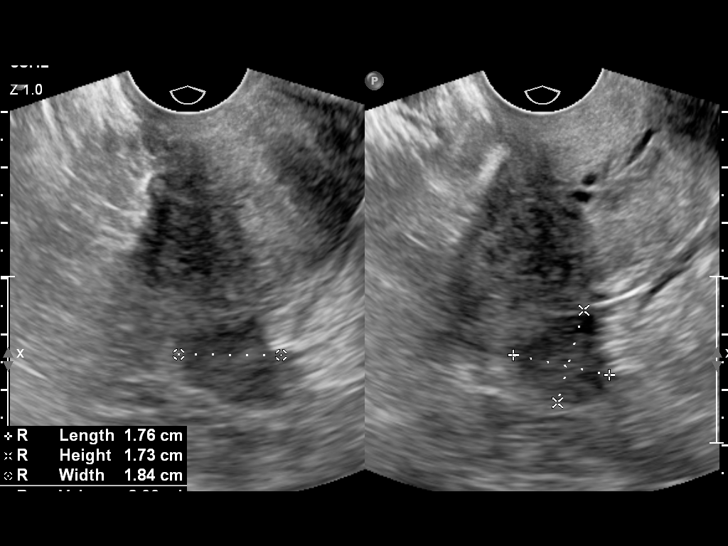
[im 29/29]
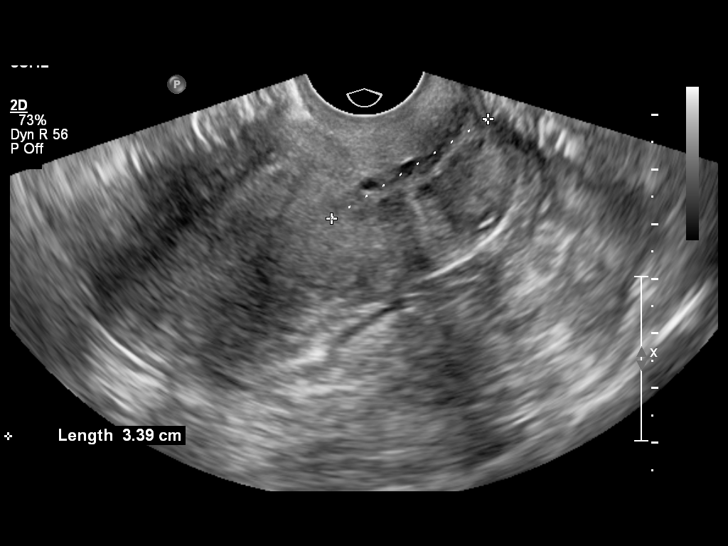

[14 of 25 positions shown; findings below may reference images not displayed]

This is a summary report. The complete report is available in the patient's medical record. If you cannot access the medical record, please contact the sending organization for a detailed fax or copy.
Uterus measures 8.46 x 4.42 x 5.31cm. volume 103.96ml  Fibroid measuring .9 x .8 x .9cm
Endometrium measures  1.15cm
Right ovary measures 1.76 x 1.84 x 1.73cm
Left ovary measures 2.39 x 2.49 x 1.61cm
Cervix 3.39cm
No fluid seen in the pelvis

## 2022-04-15 IMAGING — CR XR PELVIS 1-2 VIEWS
1 series · 1 of 1 positions shown · non-contrast
Comparison: None
There is no acute fracture in the pelvic bones, sacrum, or proximal femurs. The hip and SI joints appear symmetric. The soft tissues appear unremarkable.

No known trauma
FINAL REPORT:
Pelvis one view:
CLINICAL INDICATION: Sacrococcygeal disorders, not elsewhere classified
Other chronic pain
Trochanteric bursitis, right hip

[AP]
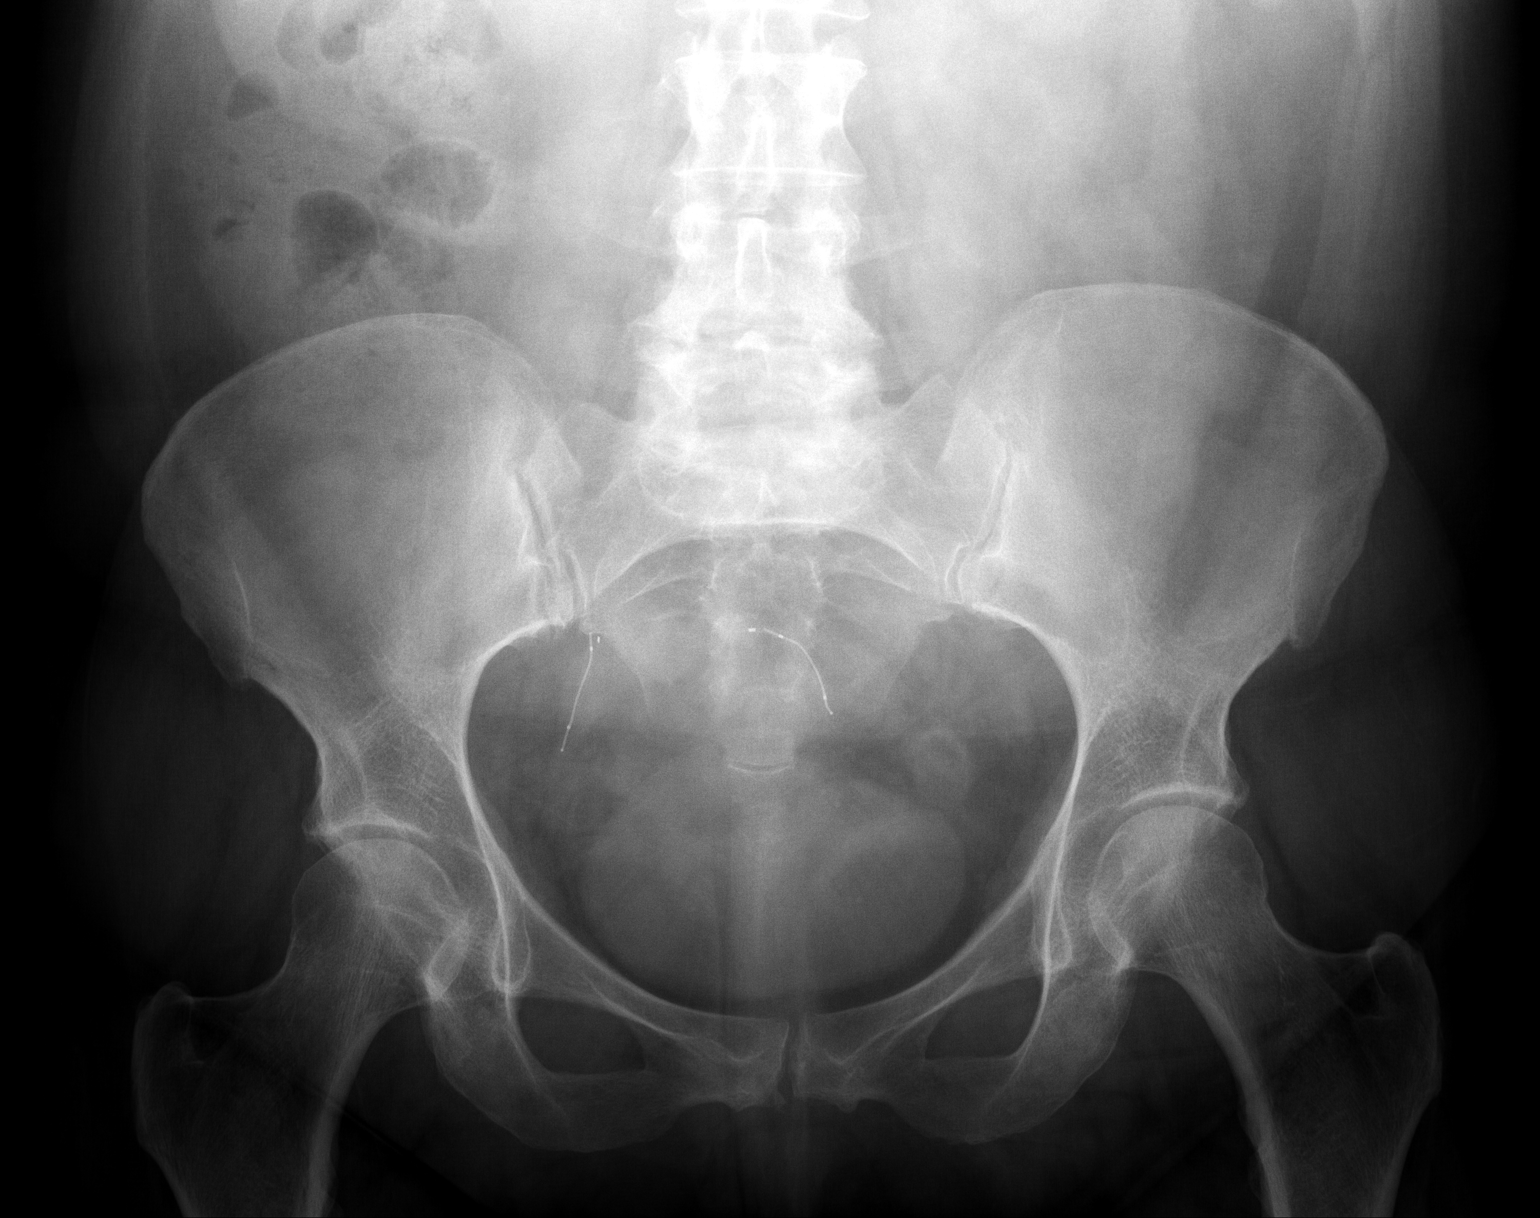

[1 of 1 positions shown; findings below may reference images not displayed]

IMPRESSION: Unremarkable one view pelvis.
Is the patient pregnant?
No

## 2022-04-15 IMAGING — CR XR SACRUM COCCYX 2+ VIEWS
3 series · 3 of 3 positions shown · non-contrast
Comparison: None
The sacrum appears intact.

No known trauma
FINAL REPORT:
Sacrum and coccyx 3 views:
CLINICAL INDICATION: Sacrococcygeal disorders, not elsewhere classified
Other chronic pain
Trochanteric bursitis, right hip

[AP (1 of 2)]
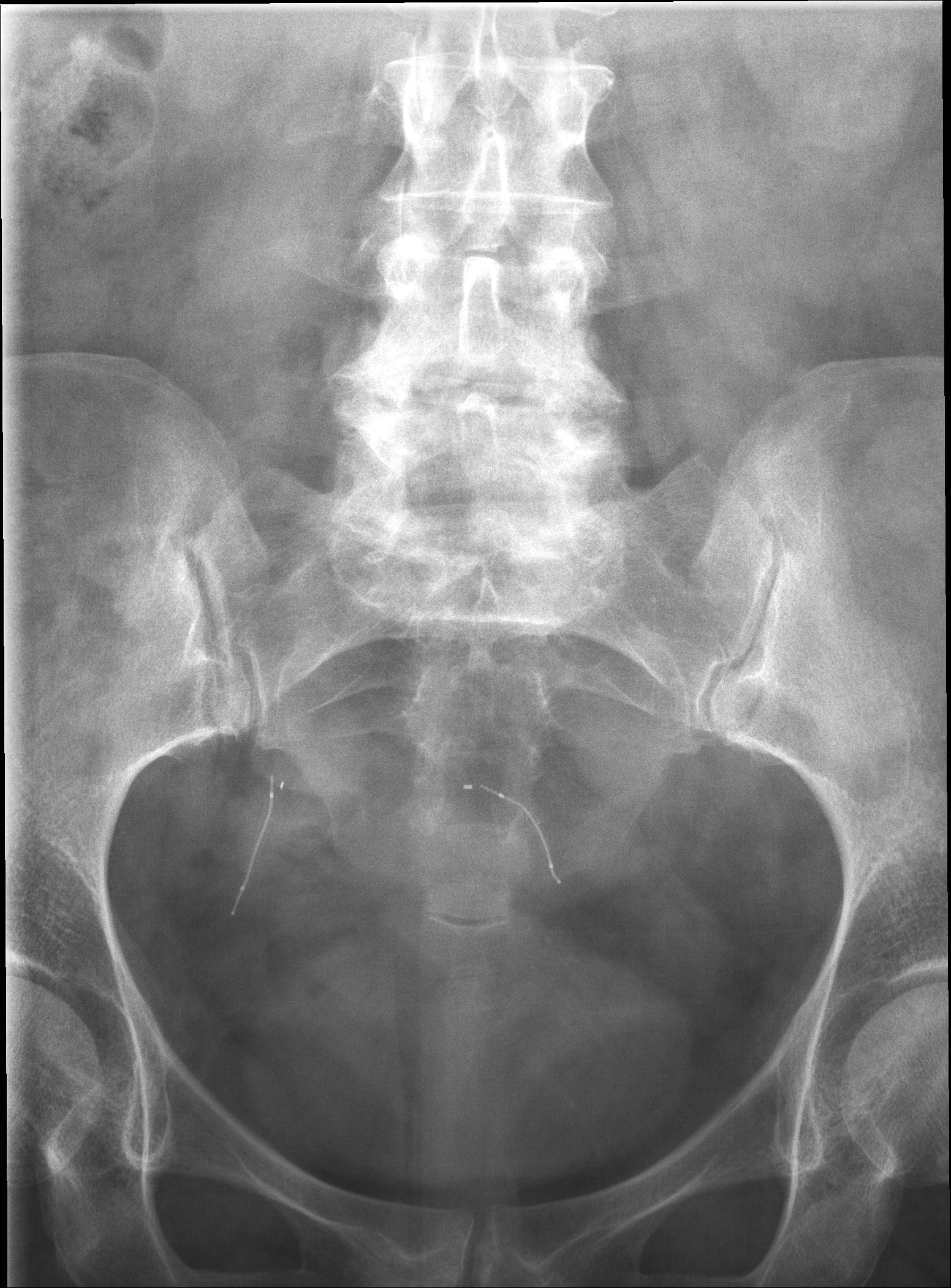

[AP (2 of 2)]
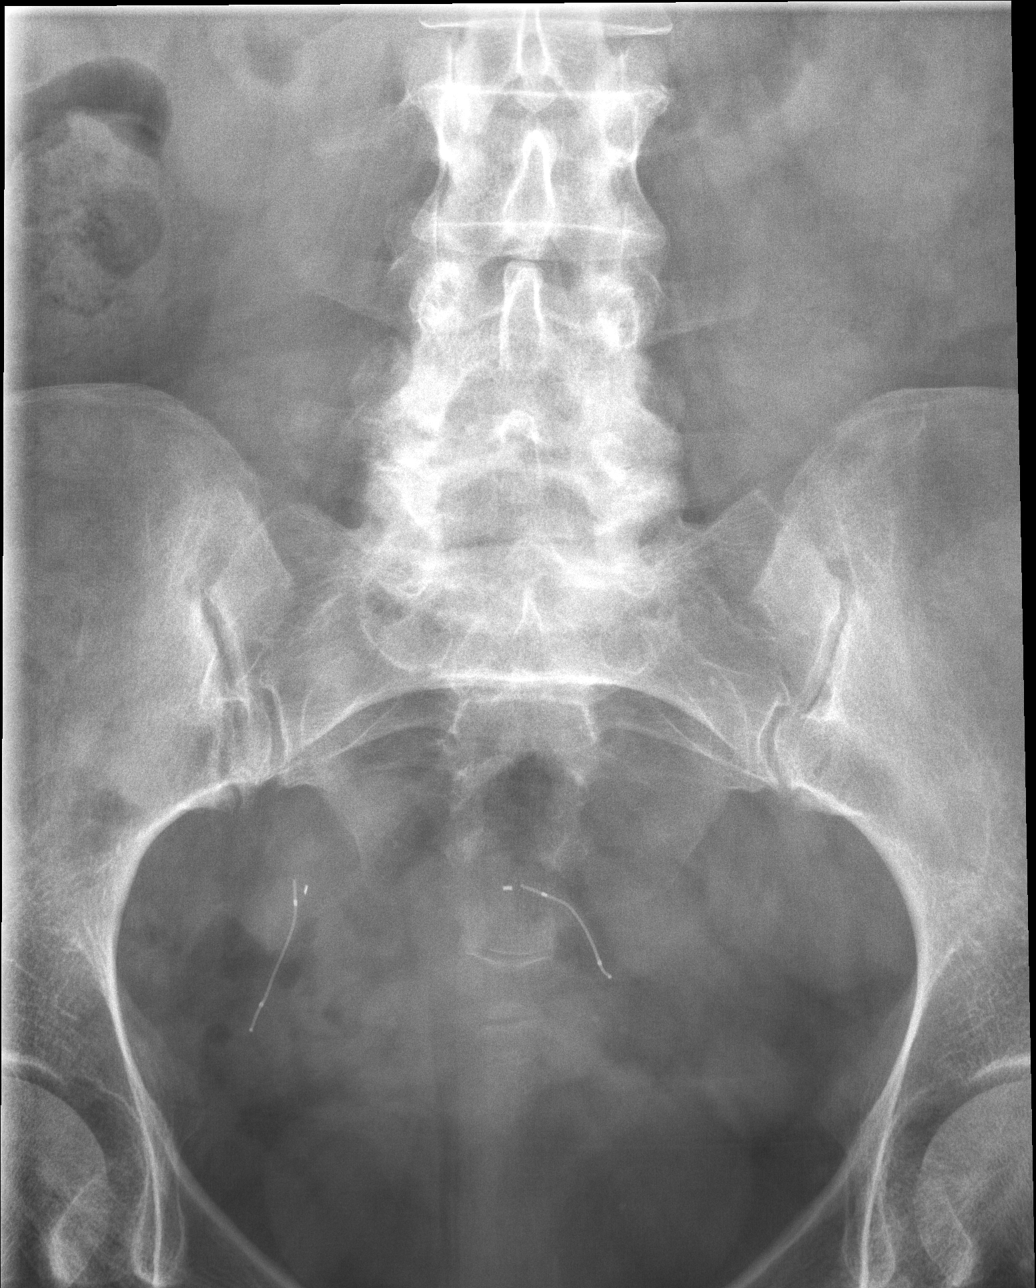

[left lateral]
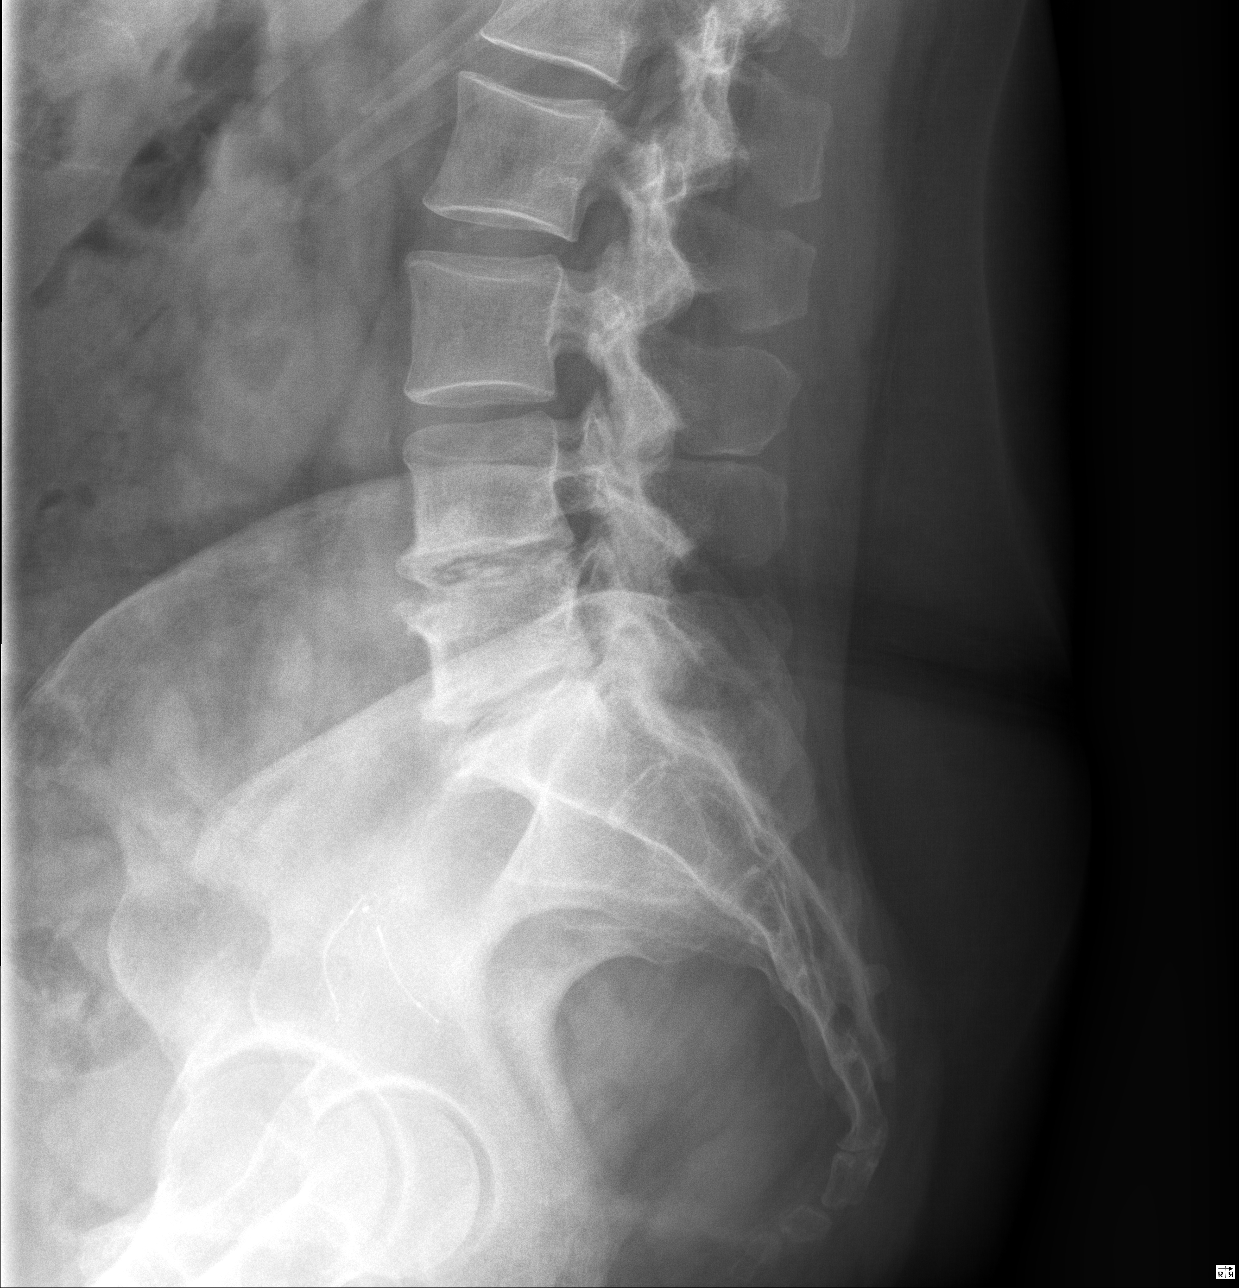

[3 of 3 positions shown; findings below may reference images not displayed]

There is mild bilateral SI joint degenerative disease. No sclerosis or ankylosis is appreciated. The coccygeal segments appear normally aligned. There is L4-5 and L5-S1 disc and facet degenerative disease.
IMPRESSION: Mild SI joint degenerative disease.
Lower lumbar spine degenerative disease.
Is the patient pregnant?
No

## 2022-04-15 IMAGING — CR XR HIP 2 OR 3 VW RIGHT
2 series · 2 of 2 positions shown · non-contrast
Comparison: None
There is no evidence of acute fracture.

FINAL REPORT:
Right hip 2 views:
CLINICAL INDICATION: Sacrococcygeal disorders, not elsewhere classified
Other chronic pain
Trochanteric bursitis, right hip

[AP]
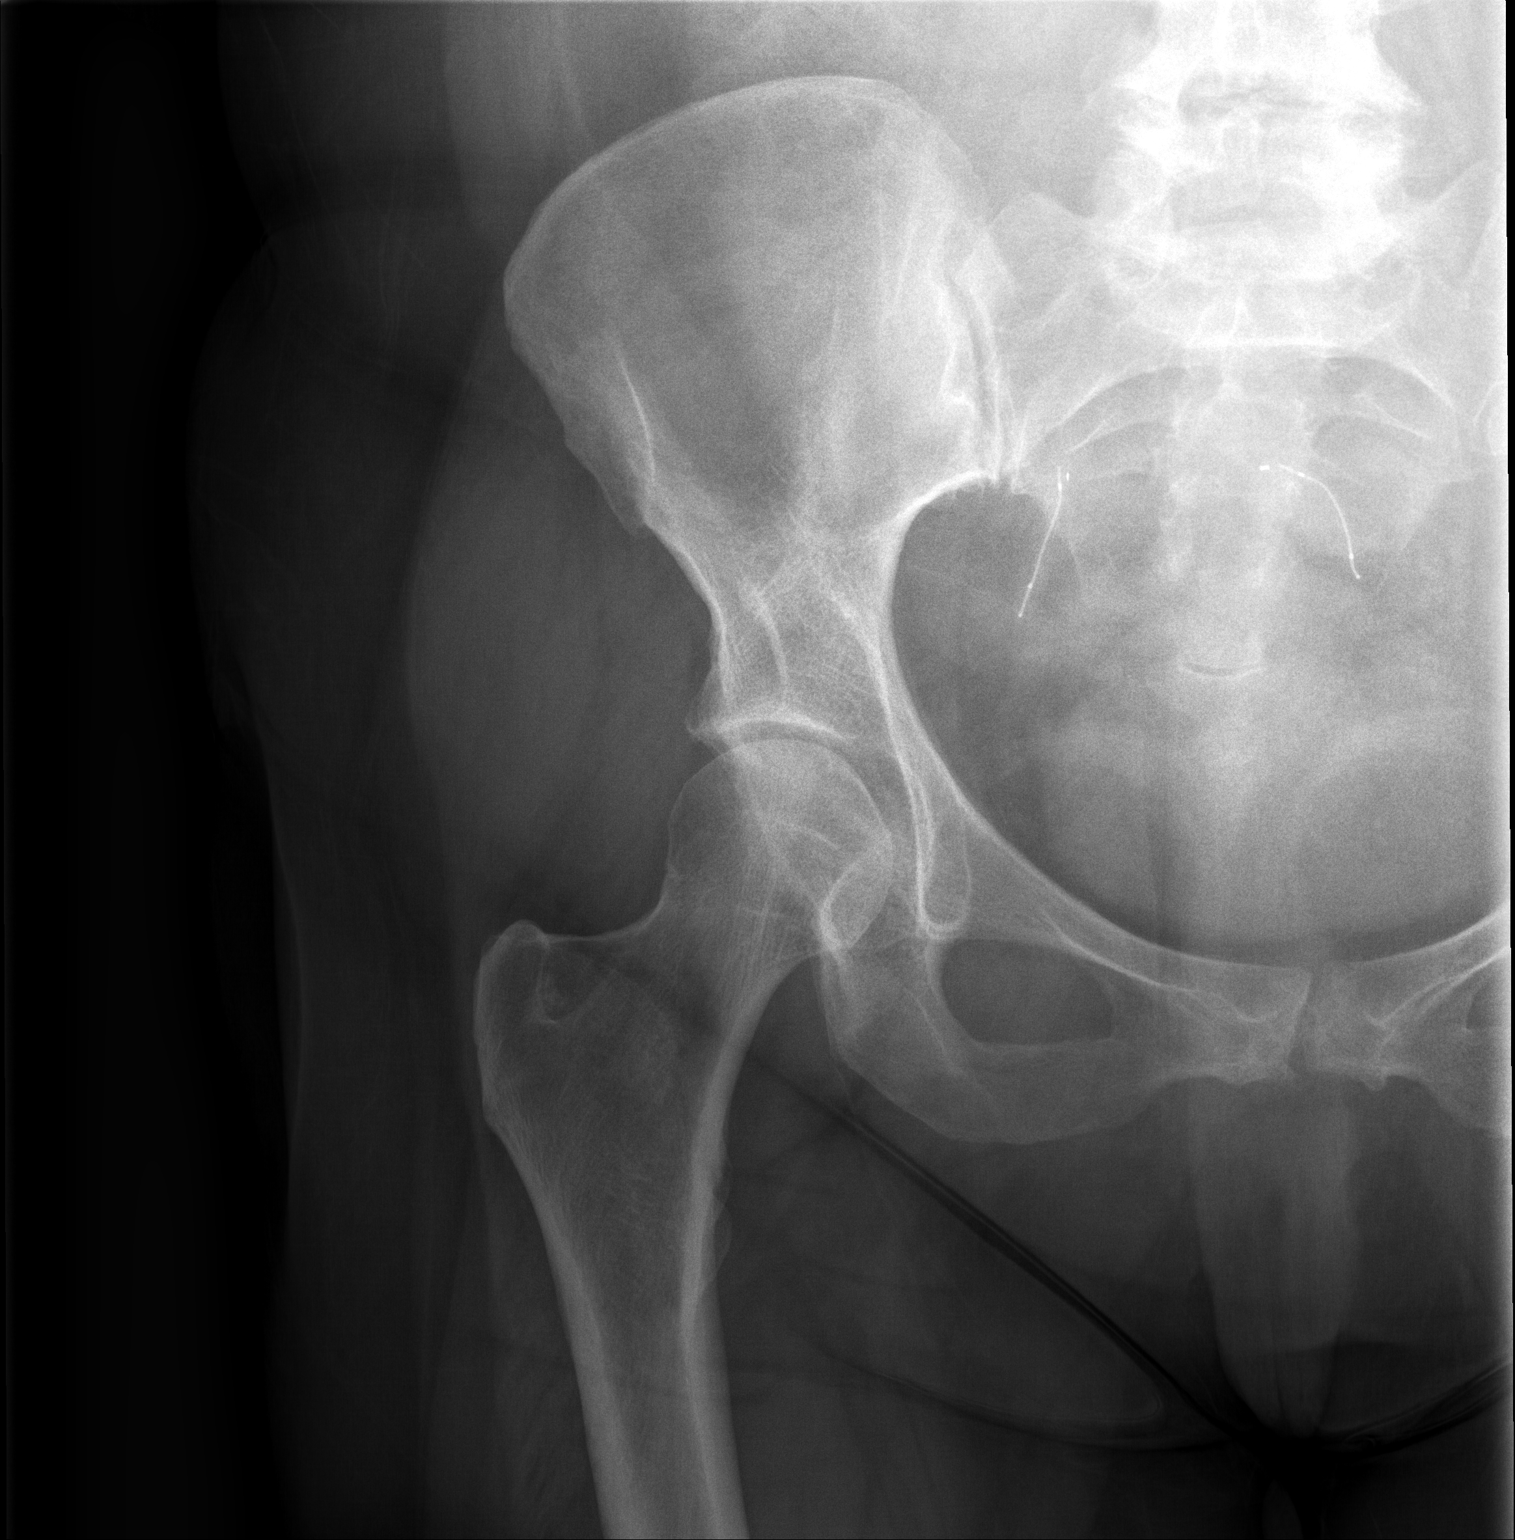

[right lateral]
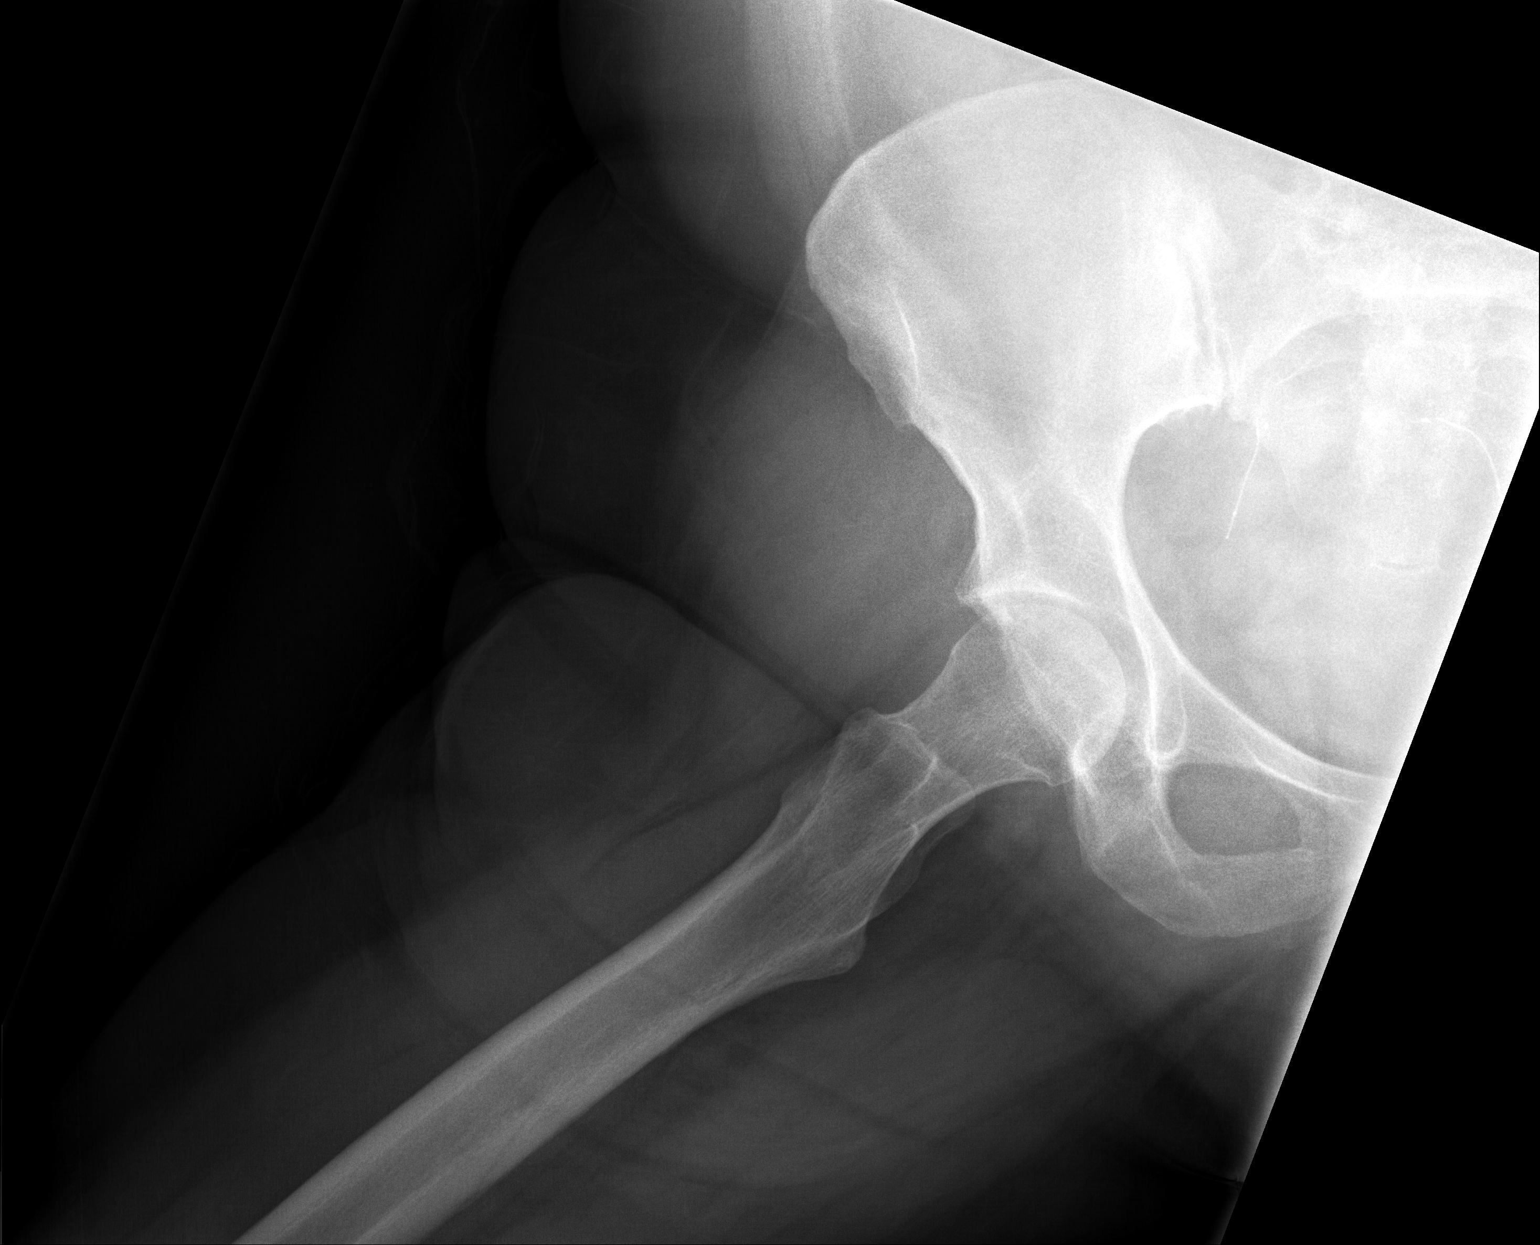

[2 of 2 positions shown; findings below may reference images not displayed]

The right hip joint space appears maintained. The soft tissues appear unremarkable.
IMPRESSION: Unremarkable right hip.
Is the patient pregnant?
No

## 2022-04-15 IMAGING — CR XR HIP 2 OR 3 VW LEFT
2 series · 2 of 2 positions shown · non-contrast
Comparison: None
There is no evidence of acute fracture.

FINAL REPORT:
Left hip 2 views:
CLINICAL INDICATION: Sacrococcygeal disorders, not elsewhere classified
Other chronic pain
Trochanteric bursitis, right hip

[AP]
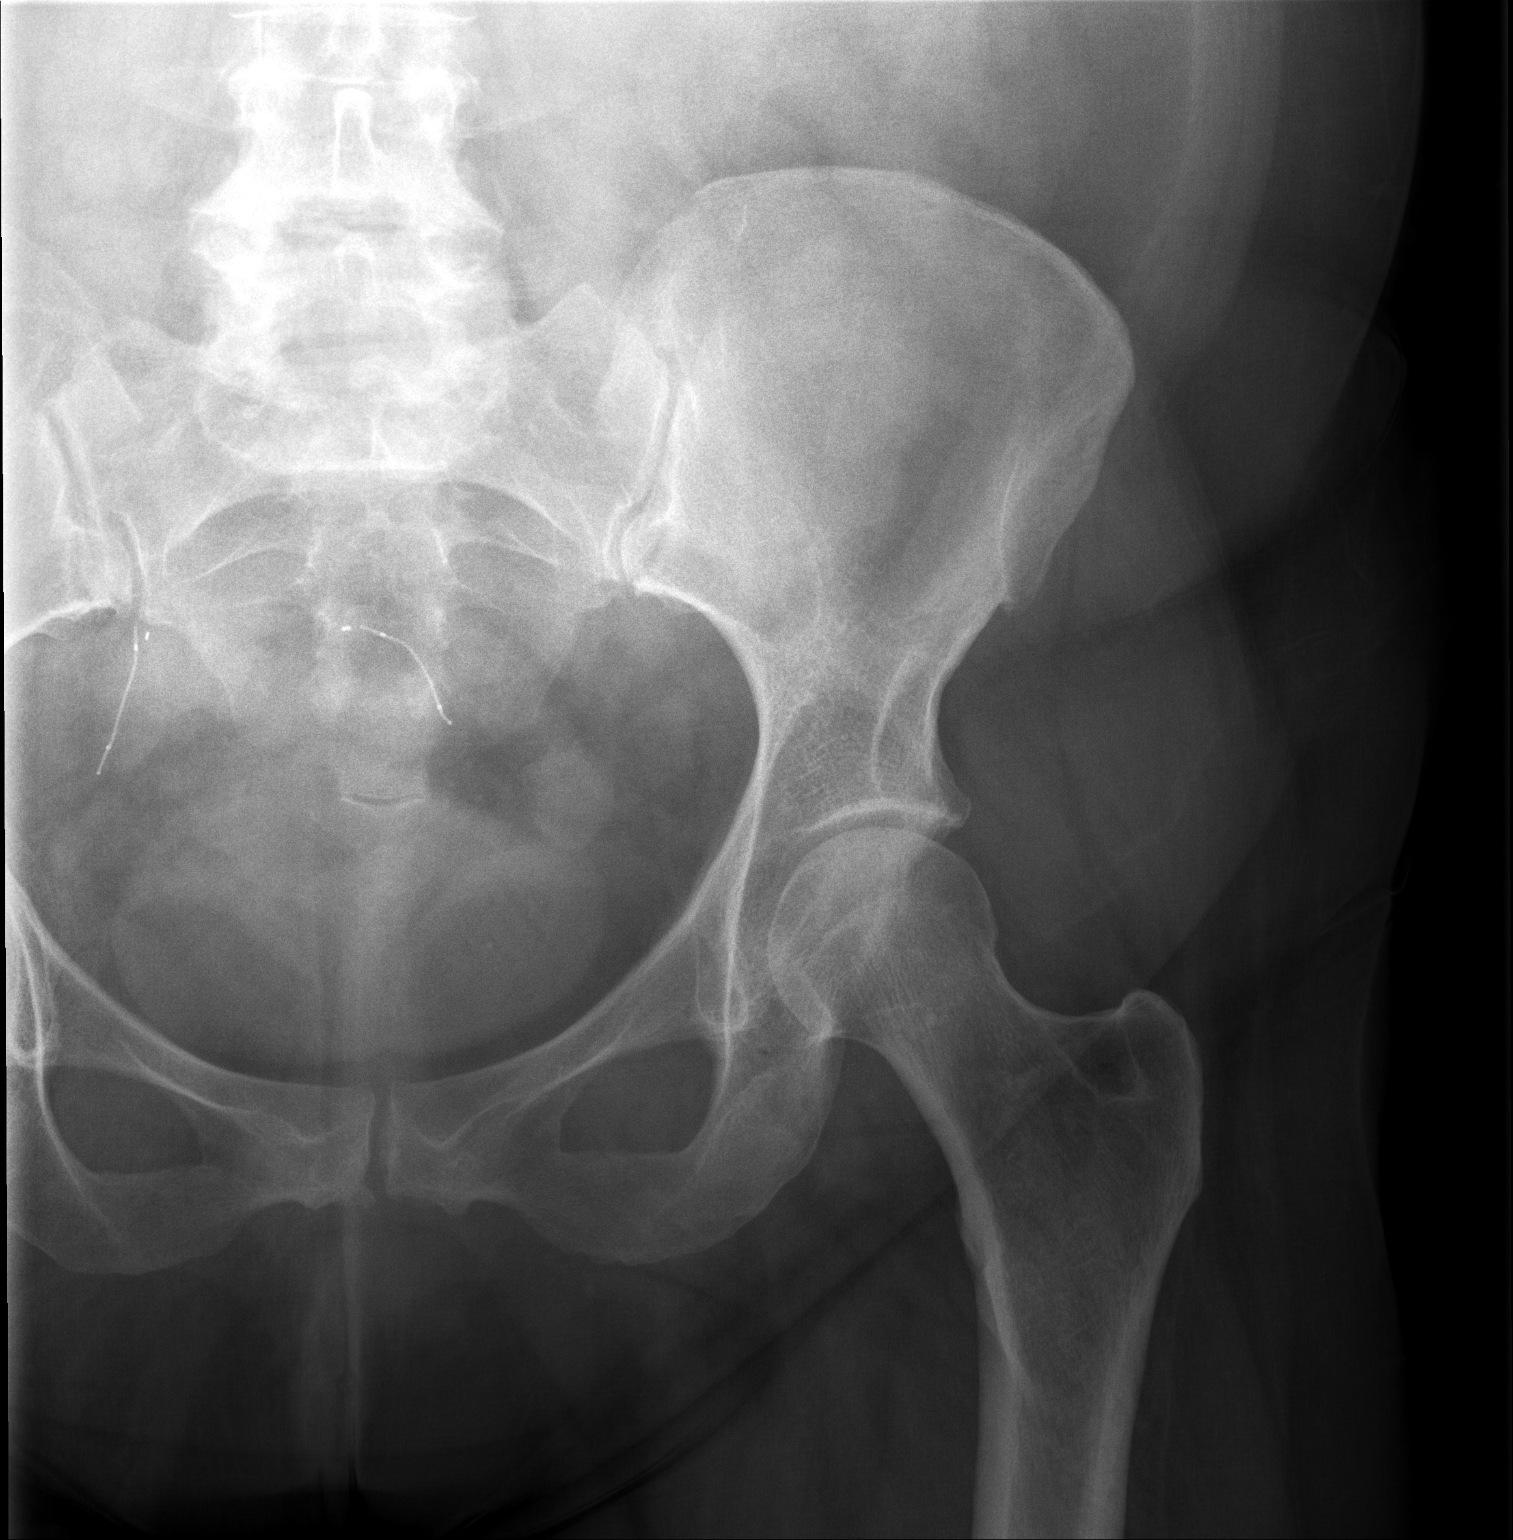

[left lateral]
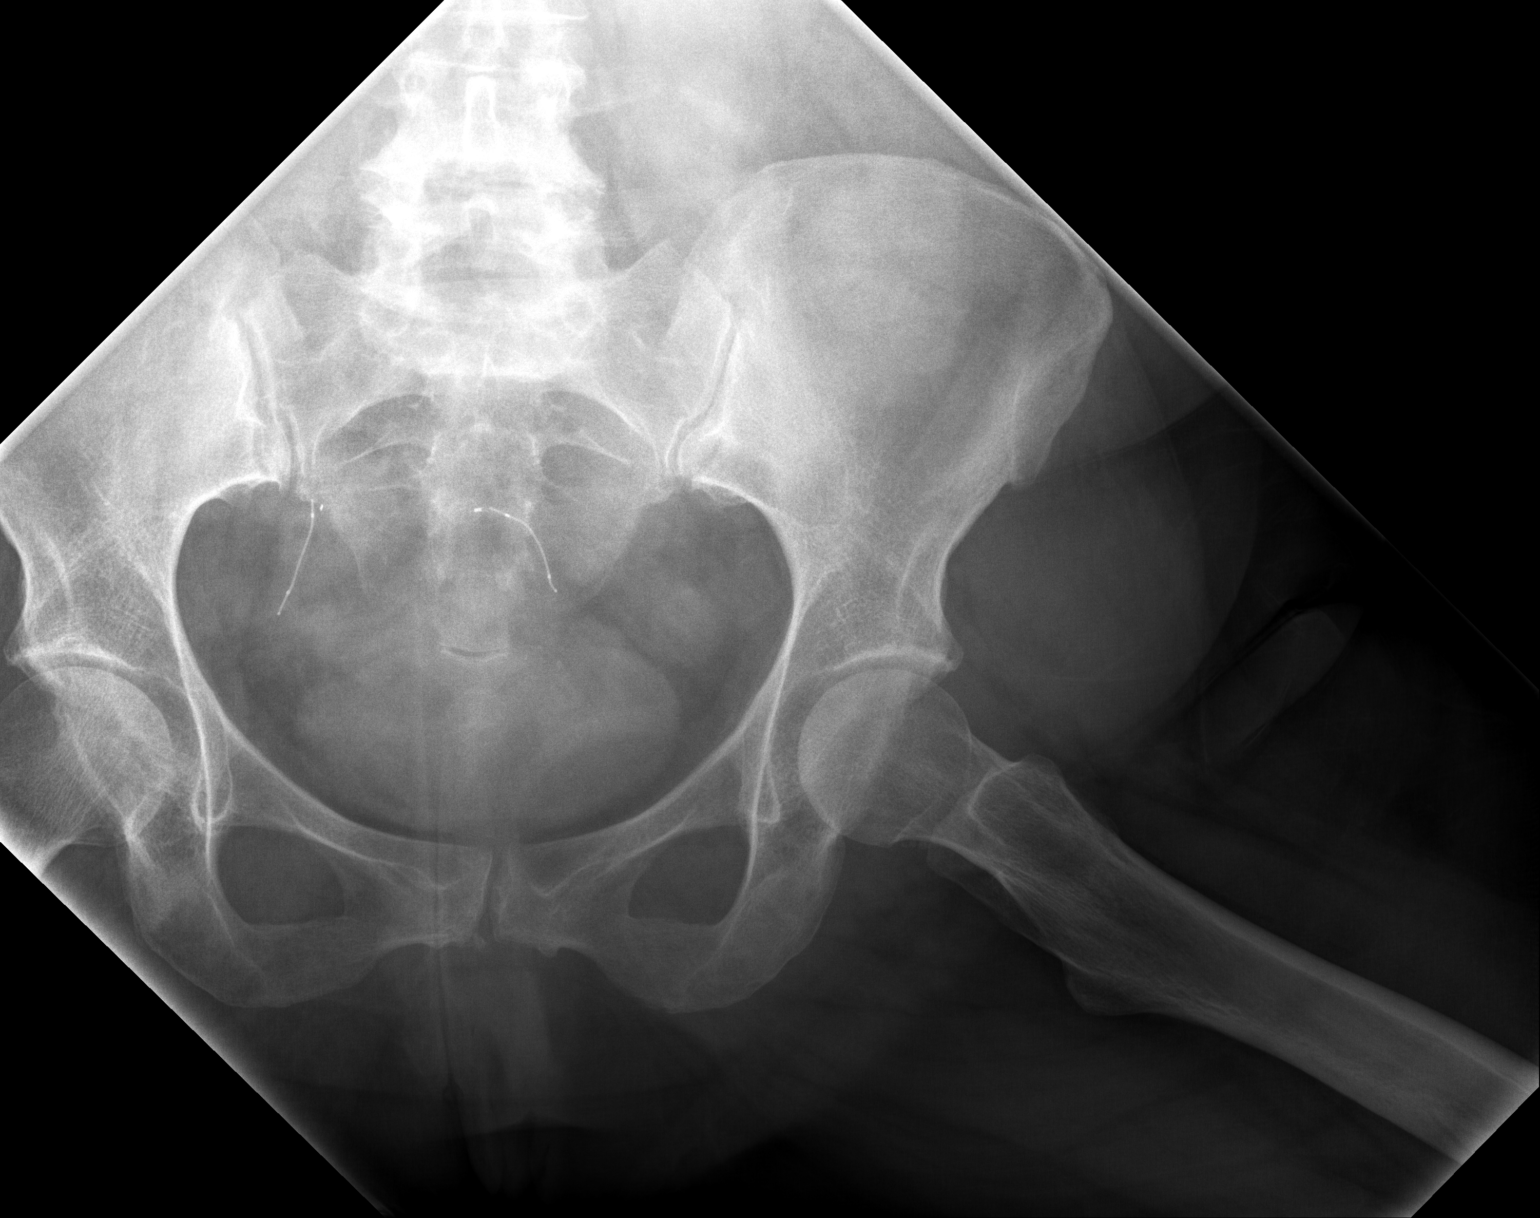

[2 of 2 positions shown; findings below may reference images not displayed]

The left hip joint space appears maintained. The soft tissues appear unremarkable.
IMPRESSION: Unremarkable left hip.
Is the patient pregnant?
No

## 2022-05-07 IMAGING — DX XR CHEST 1 VIEW
1 series · 1 of 1 positions shown · non-contrast
Comparison: 04/20/2019

FINAL REPORT:
Chest portable one view
INDICATION: chest Pain

[AP]
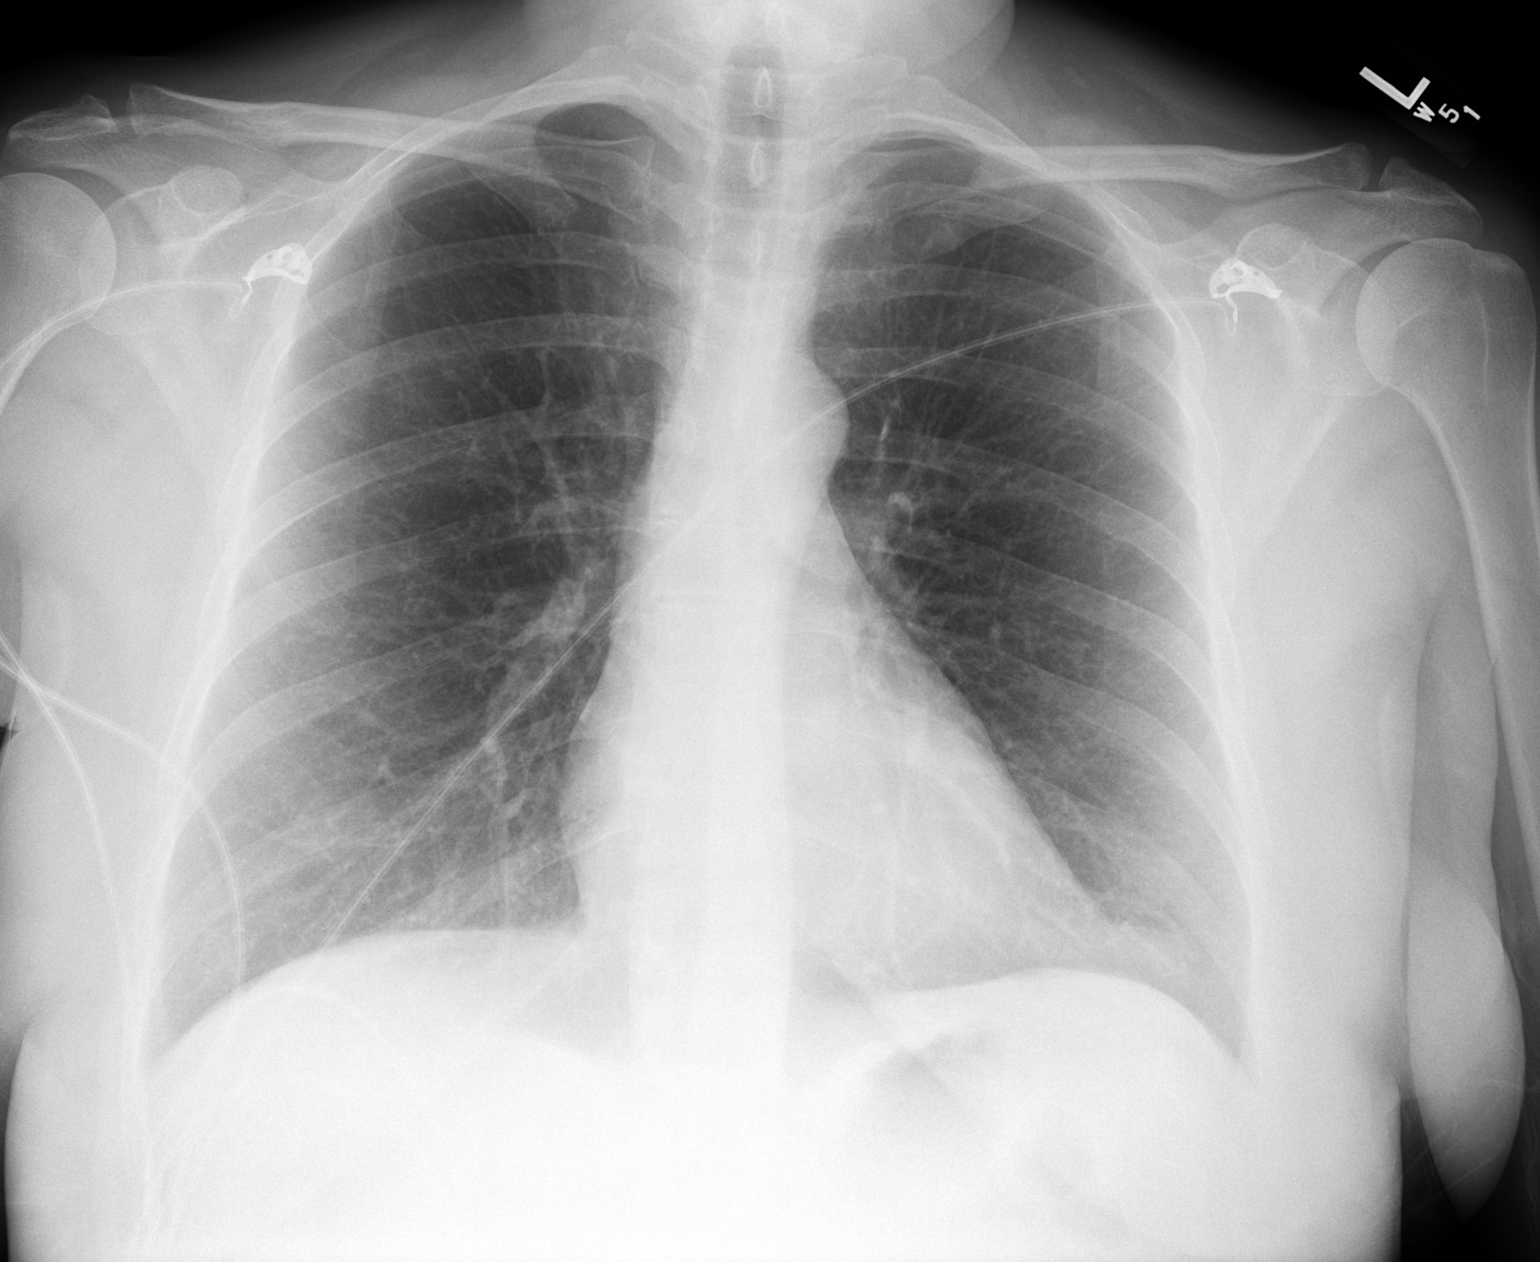

[1 of 1 positions shown; findings below may reference images not displayed]

FINDINGS: The lungs are clear.   The cardiac silhouette is within normal limits  given portable technique.  No acute osseous abnormalities.
IMPRESSION: 
IMPRESSION: No acute cardiopulmonary process.
Is the patient pregnant?
Waiting for Lab Result

## 2022-05-07 IMAGING — US US OB TRANSVAGINAL
1 series · 14 of 28 positions shown · non-contrast
Comparison: None
Transabdominal and transvaginal imaging is provided.

FINAL REPORT:
Ultrasound OB limited first trimester:
CLINICAL INDICATION: Pelvic pain

[Series 1: us ob transvaginal · 14 of 65 slices shown]
[im 3/65]
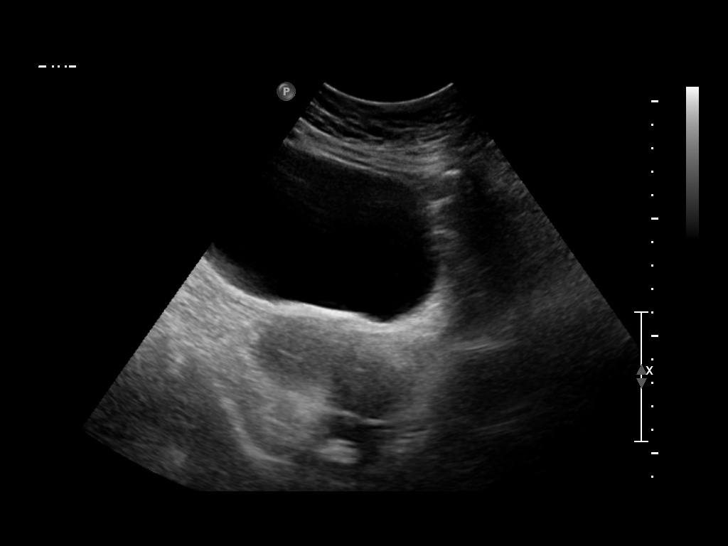
[im 8/65]
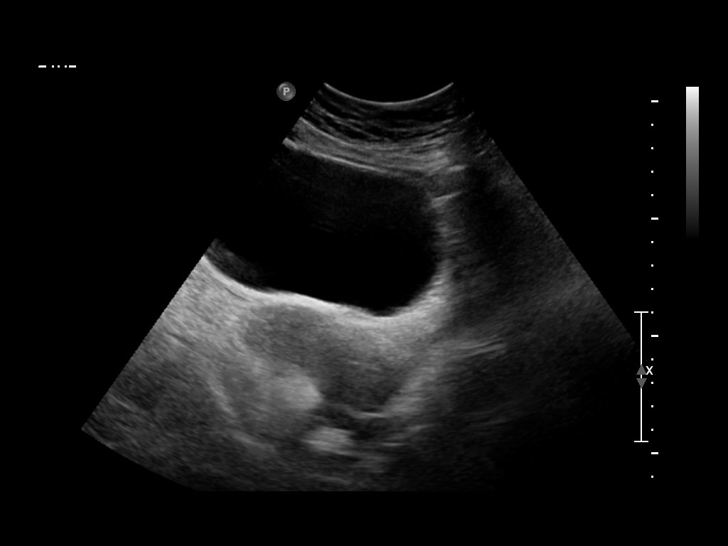
[im 12/65]
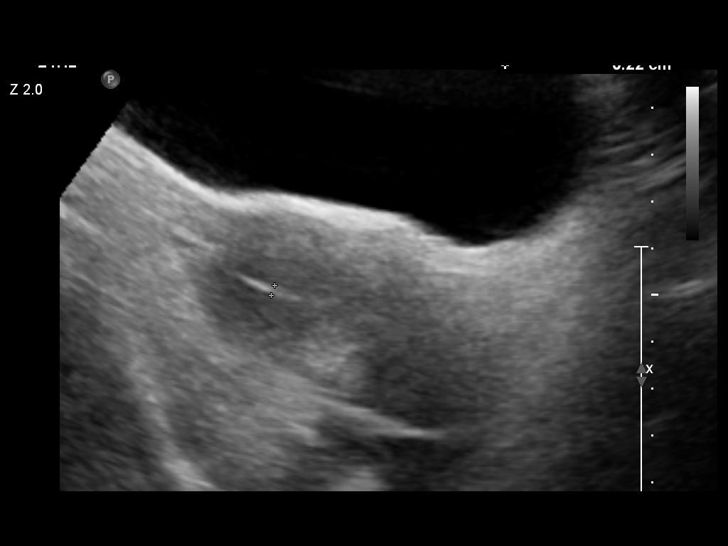
[im 17/65]
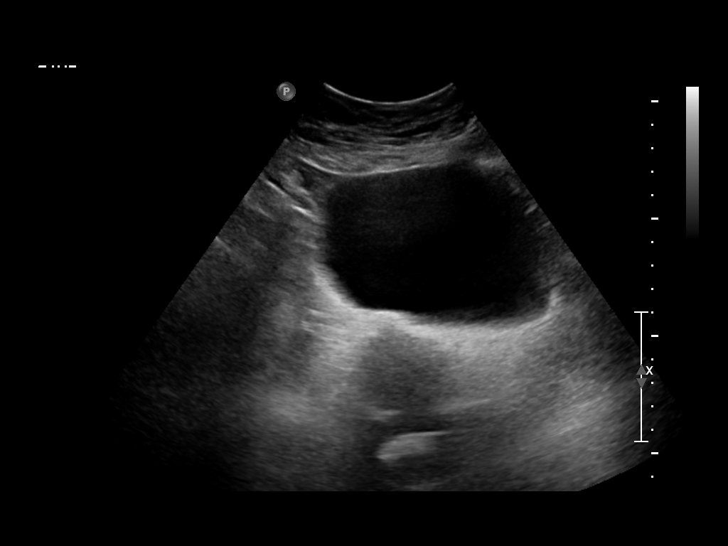
[im 22/65]
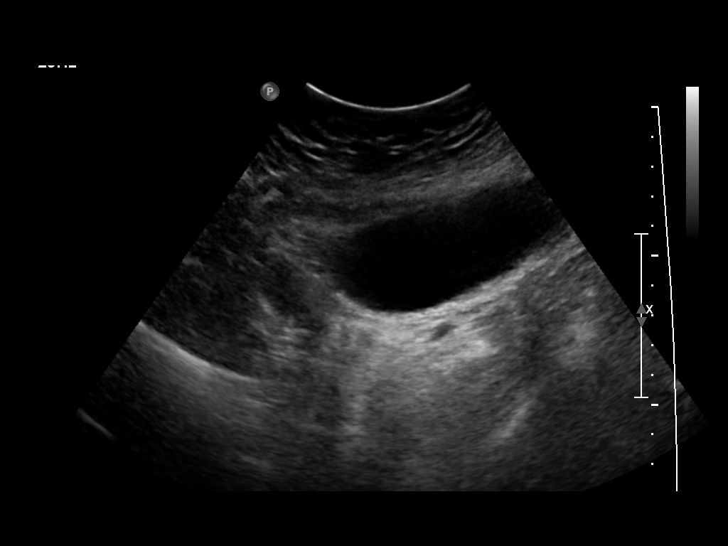
[im 27/65]
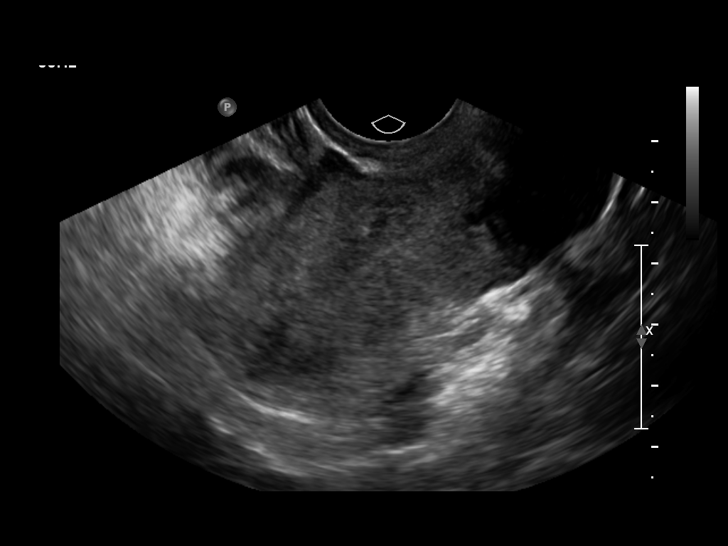
[im 31/65]
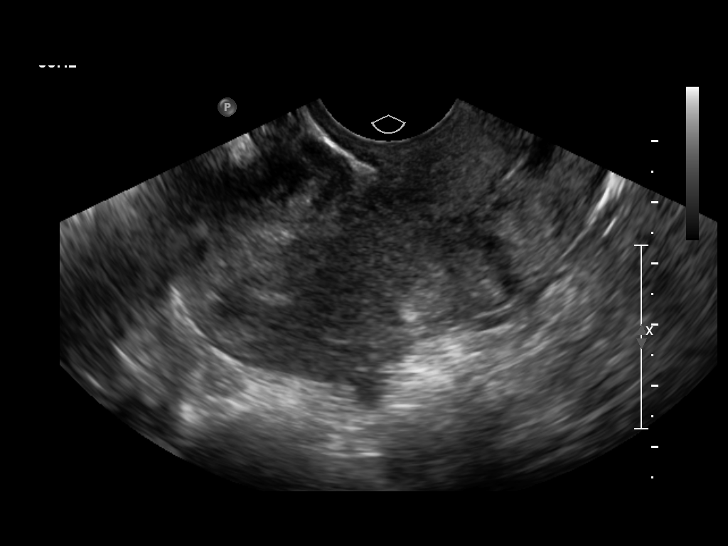
[im 36/65]
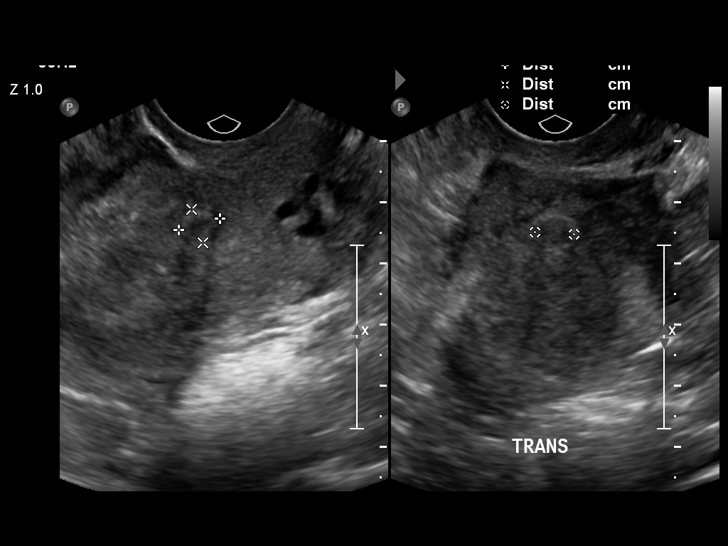
[im 41/65]
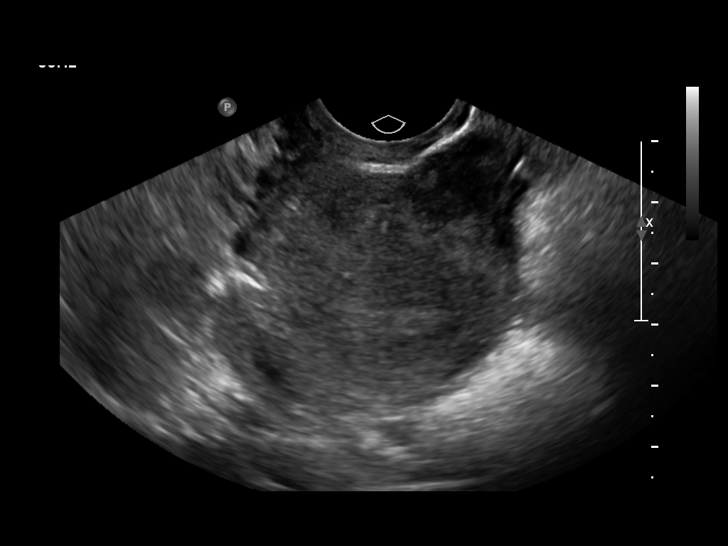
[im 46/65]
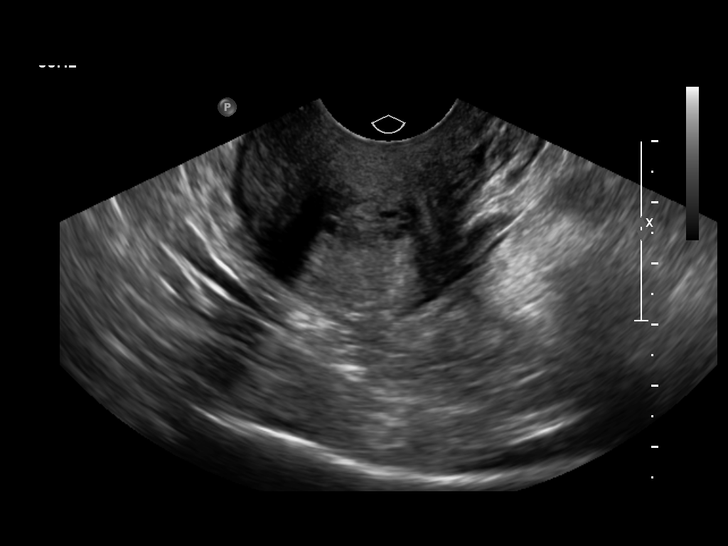
[im 50/65]
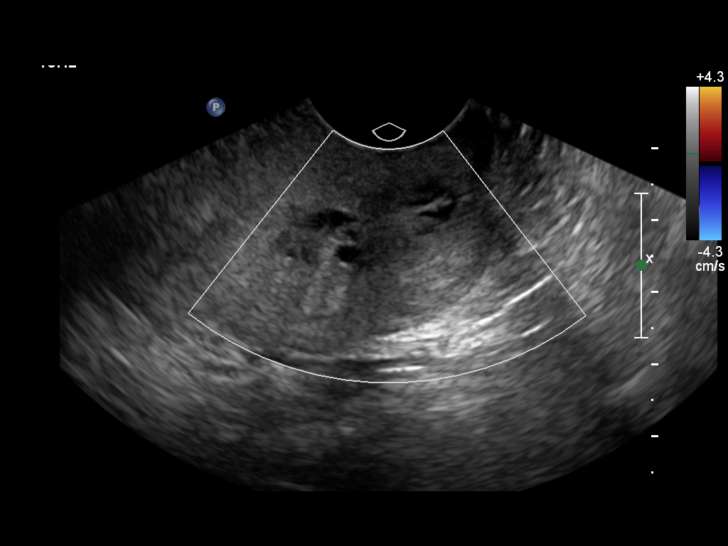
[im 55/65]
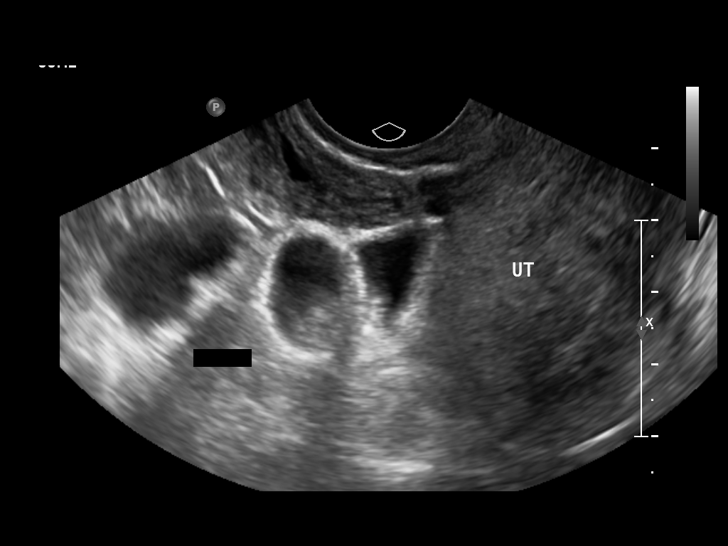
[im 60/65]
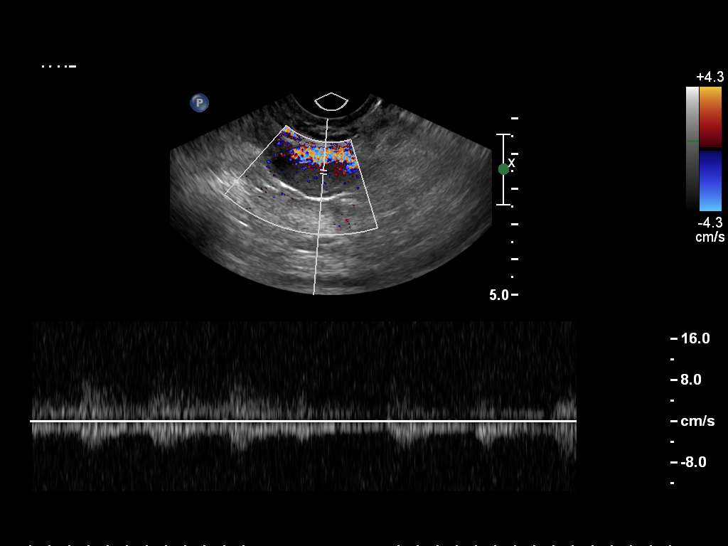
[im 65/65]
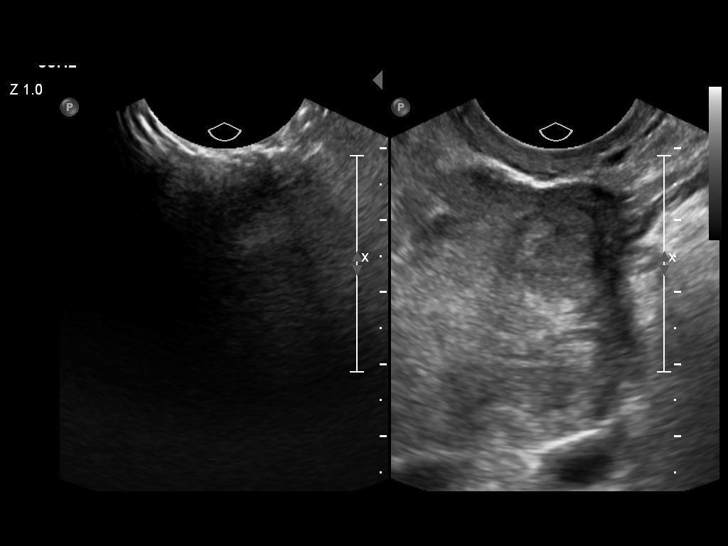

[14 of 28 positions shown; findings below may reference images not displayed]

No intrauterine pregnancy is demonstrated. The right ovary is obscured by bowel gas. Left ovary measures 2.6 x 1.4 x 1.3 cm. No adnexal mass is demonstrated. There is no pelvic free fluid.
IMPRESSION: No intrauterine pregnancy or adnexal mass. Quantitative beta-hCG level is below the threshold for pregnancy detection by ultrasound.

## 2022-09-03 IMAGING — MG MAMMO BREAST SCREENING TOMOSYNTHESIS BILATERAL
8 of 13 series · 8 of 29 positions shown · non-contrast
Comparison: [DATE], and 6076.

This is a summary report. The complete report is available in the patient's medical record. If you cannot access the medical record, please contact the sending organization for a detailed fax or copy.
FINAL REPORT:
EXAM: MAMMO BREAST SCREENING TOMOSYNTHESIS 3D BILATERAL
CLINICAL HISTORY: Screening mammogram. No complaints. Family history of breast carcinoma in the patient's grandmother and mother.
TECHNIQUE: MLO and CC views of both breasts with tomosynthesis were obtained on a digital full-field mammographic unit. This examination was interpreted in conjunction with computer aided detection system. Nipple and skin markers were placed as needed.

[L XCCL]
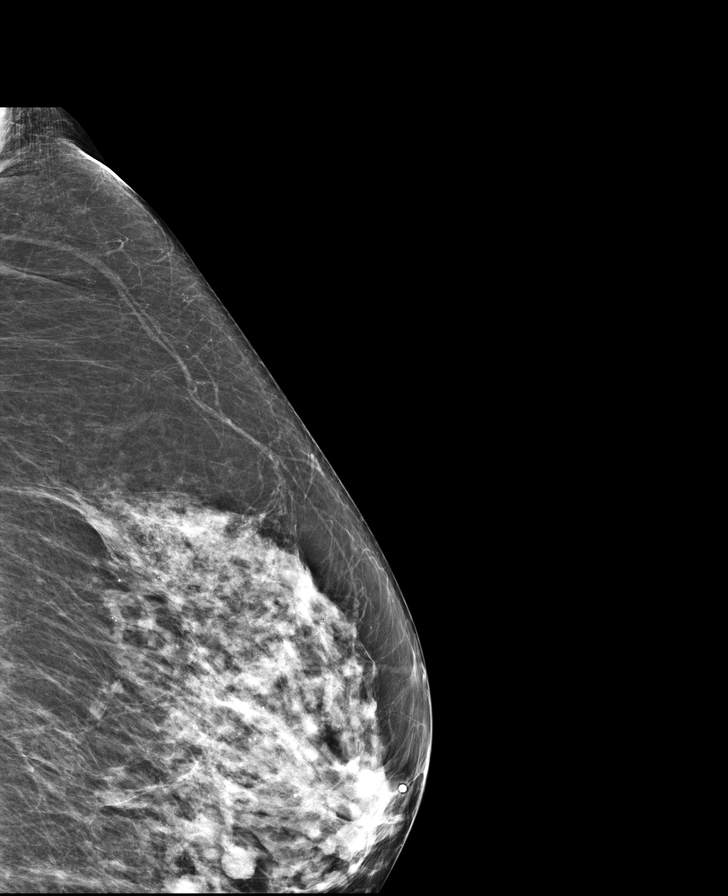

[R MLO synth-2D]
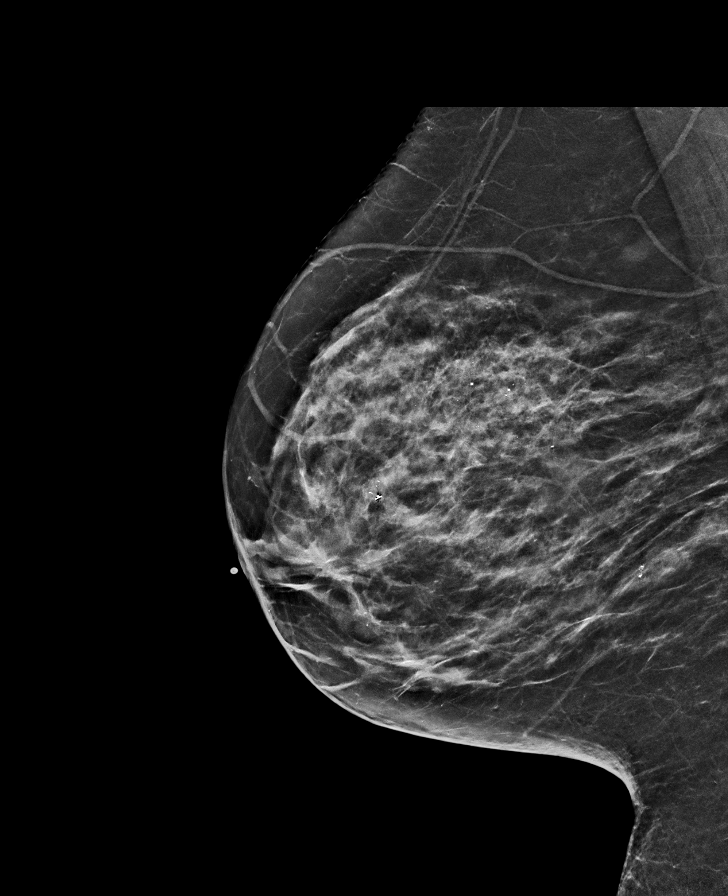

[L CC]
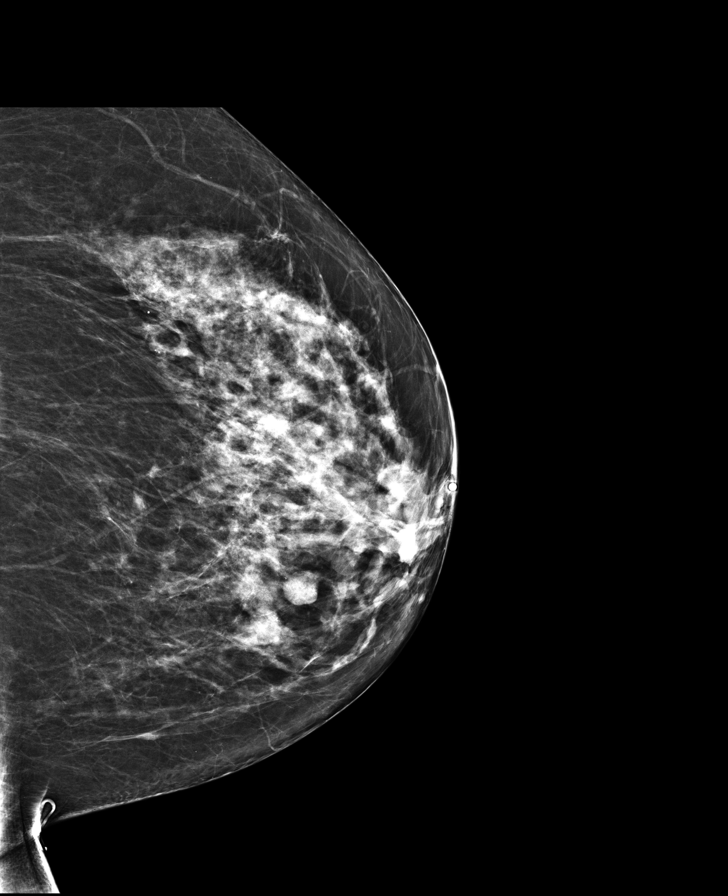

[R MLO]
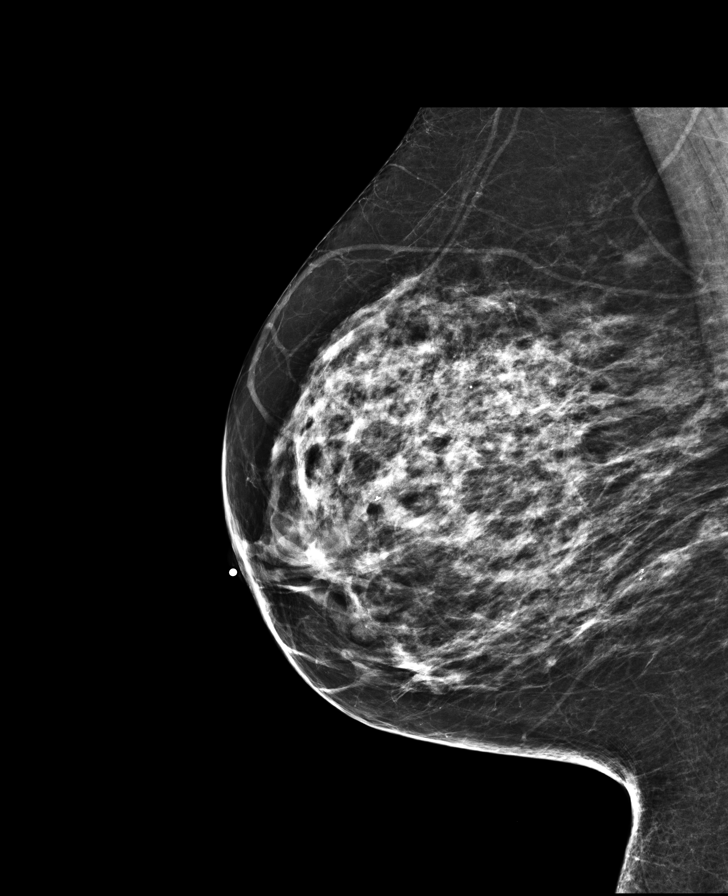

[L MLO synth-2D]
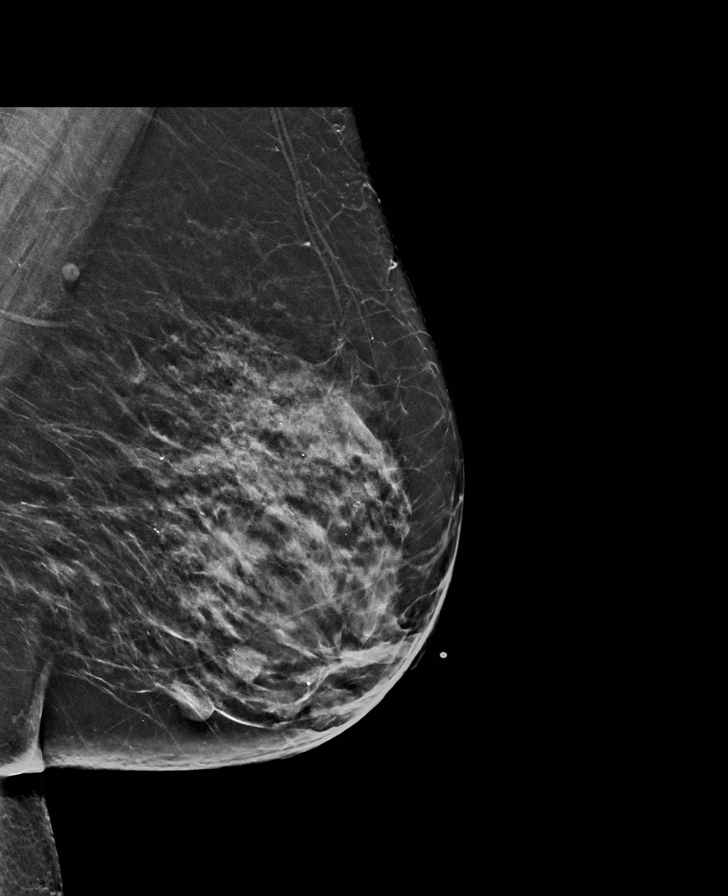

[L MLO]
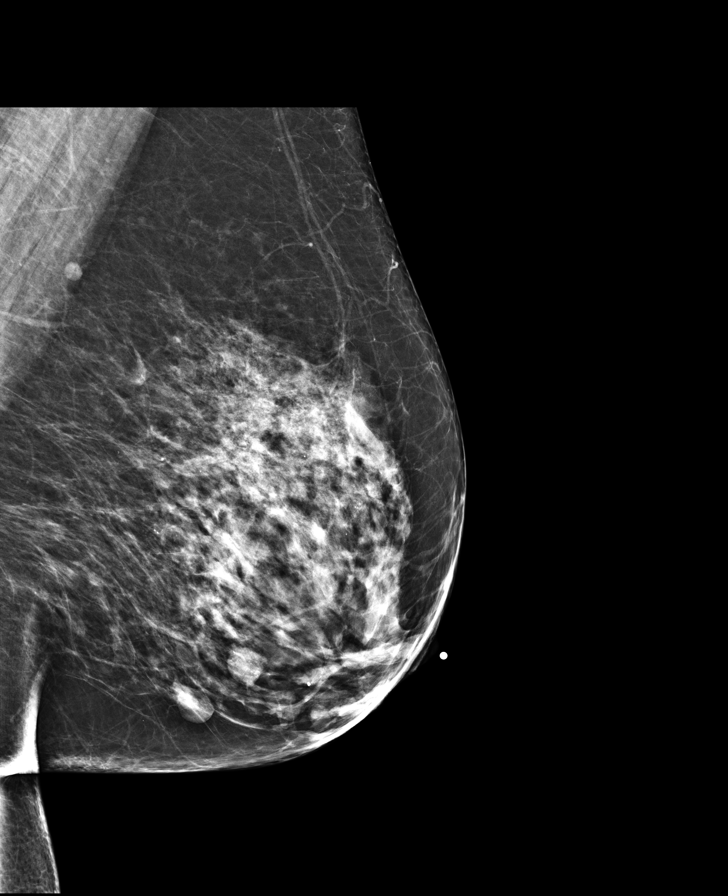

[L CC synth-2D]
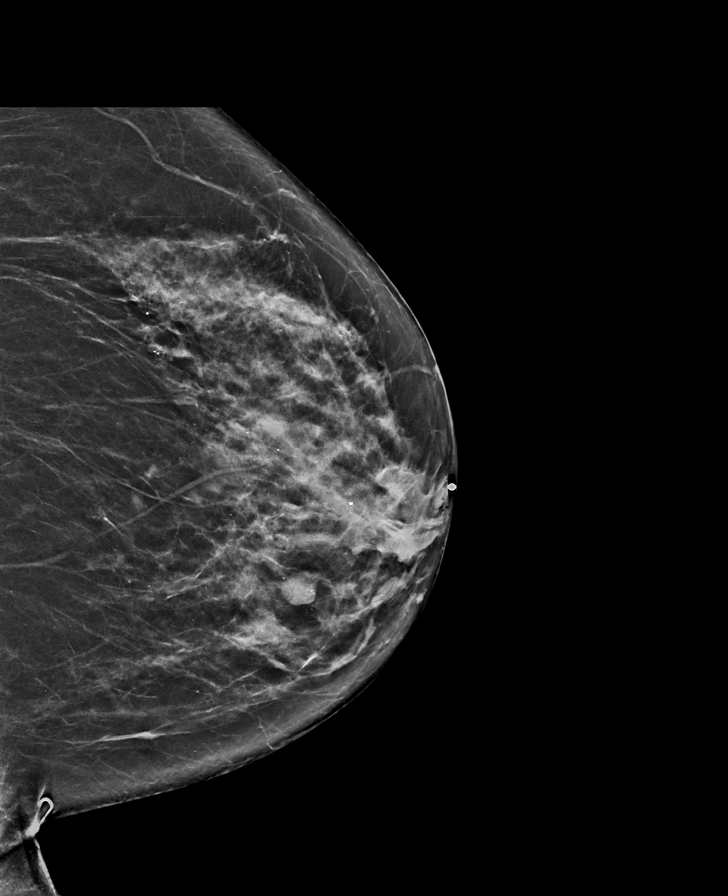

[R CC]
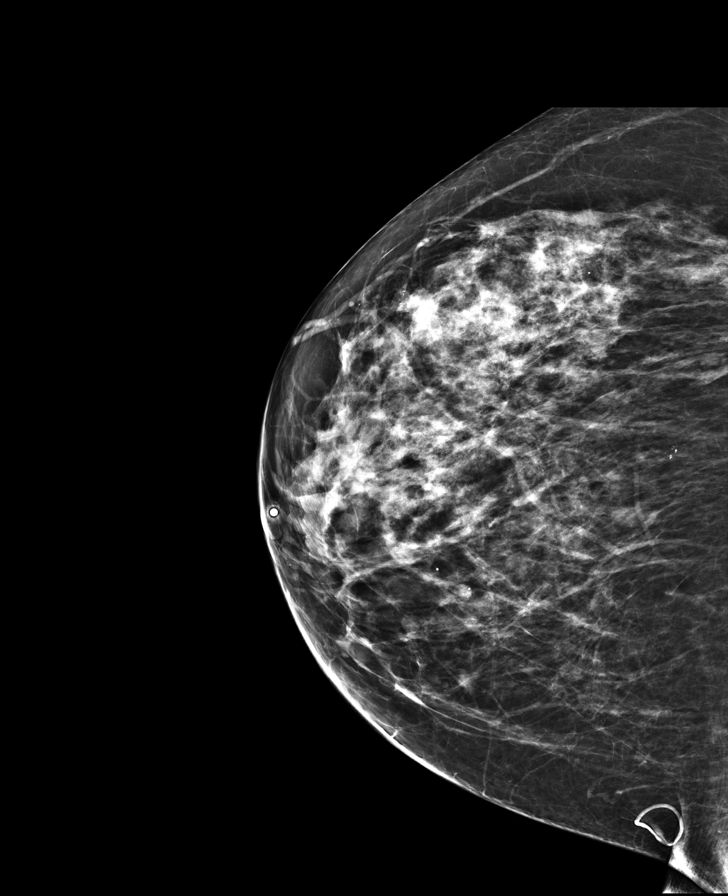

[8 of 29 positions shown; findings below may reference images not displayed]

FINDINGS: BREAST DENSITY TYPE C: The breasts are heterogeneously dense, which may obscure small masses.
When compared to the prior exam, there has been no significant interval change. The skin, nipples, and both axillae are unremarkable. There is no evidence of a dominant mass, suspicious cluster of calcifications, or architectural distortion in either breast. There are stable bilateral benign-appearing masses in the breasts dated back to multiple prior mammograms suggesting benign into. Stable biopsy clip in the right breast.
IMPRESSION: No mammographic evidence of malignancy.
BI-RADS Category 2: Benign
Recommendation(s): Bilateral screening mammogram in one year.
The patient information will be entered into a reminder system with a target due date for the next mammogram.
Is the patient pregnant?
No

## 2022-09-09 IMAGING — CT CT ABDOMEN PELVIS WITH CONTRAST
2 of 3 series · 14 of 32 positions shown, 19 images · non-contrast
Comparison: none

Abdomen pain n/v
Elevated WBC
Appendectomy, Cholecystectomy
FINAL REPORT:
CT scan of the abdomen and pelvis with intravenous contrast. [DATE], [id number] hours- Axial sections with sagittal and coronal reformats.
CLINICAL HISTORY: Epigastric pain
No prior study is available for comparison.

[Series 2: abdomen · axial · 0.82mm/px · z∈[-407,-74]mm · 8 of 172 slices shown, 13 images]
[im 20/172  soft-tissue]
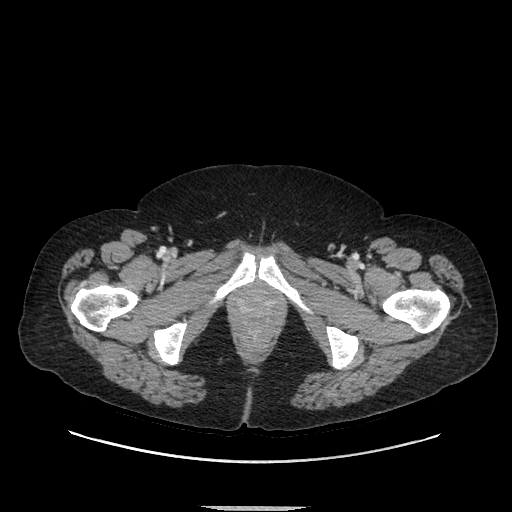
[im 20/172  bone]
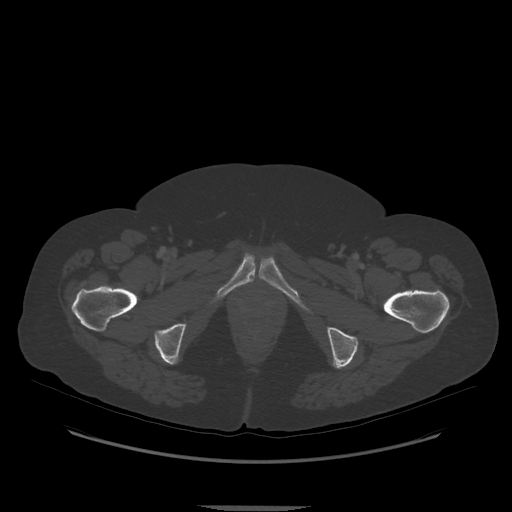
[im 39/172  soft-tissue]
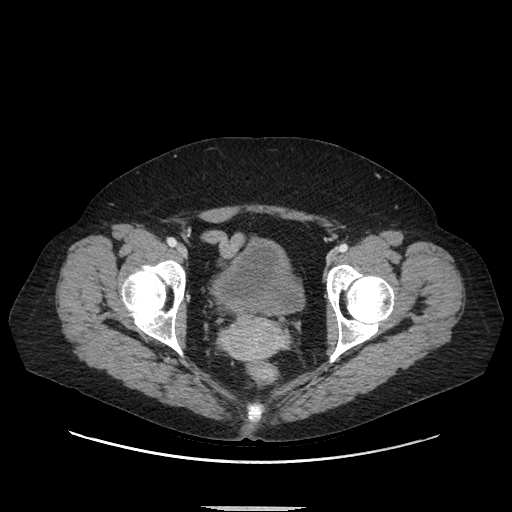
[im 58/172  soft-tissue]
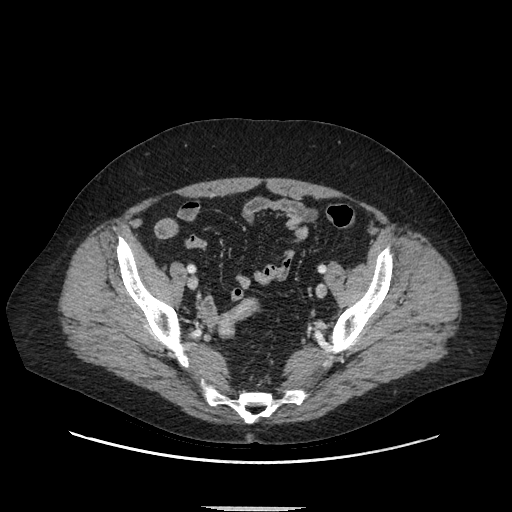
[im 77/172  soft-tissue]
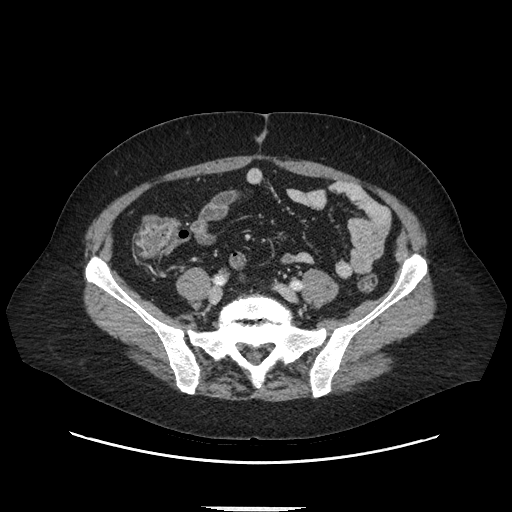
[im 96/172  soft-tissue]
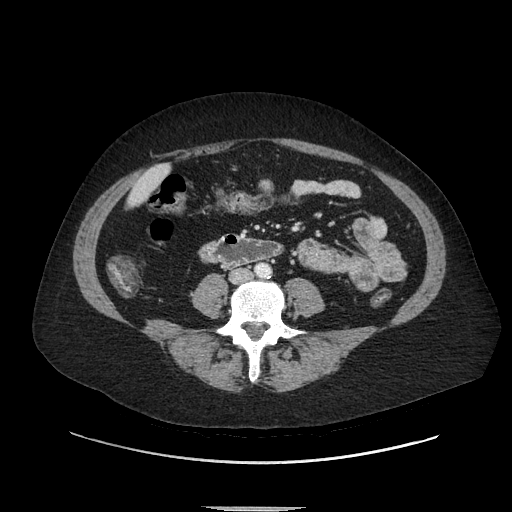
[im 96/172  lung]
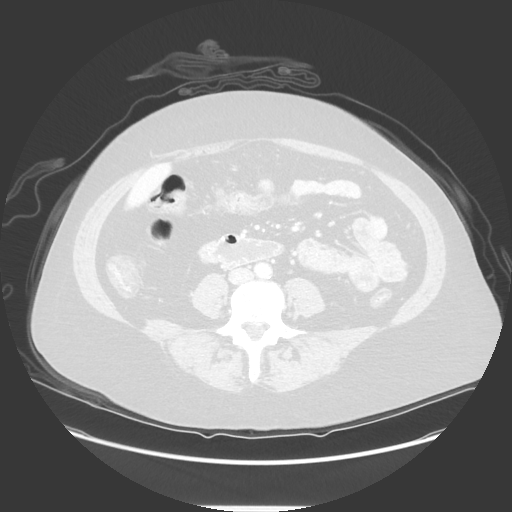
[im 115/172  soft-tissue]
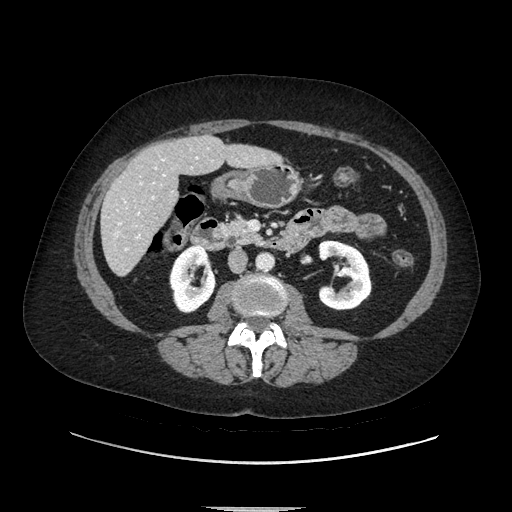
[im 115/172  lung]
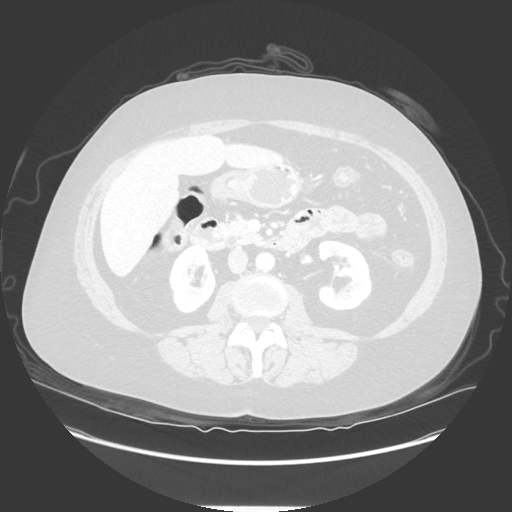
[im 134/172  soft-tissue]
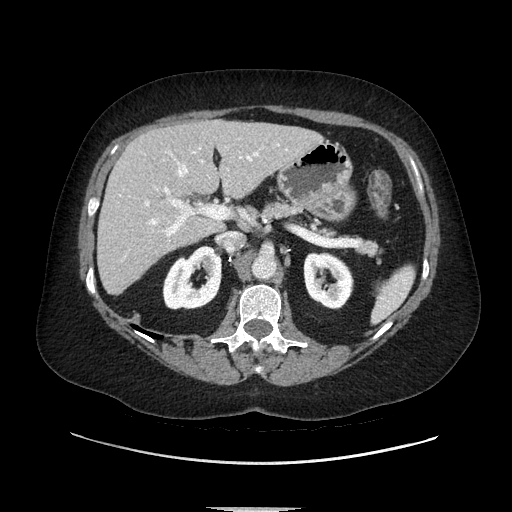
[im 134/172  lung]
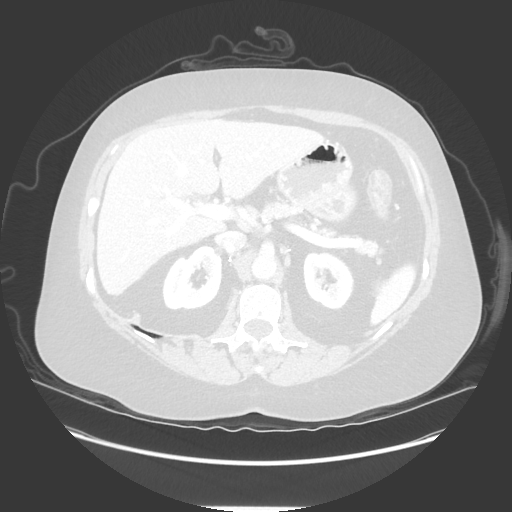
[im 153/172  soft-tissue]
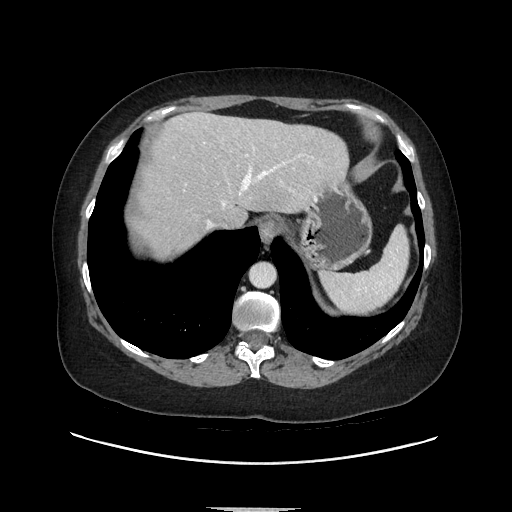
[im 153/172  lung]
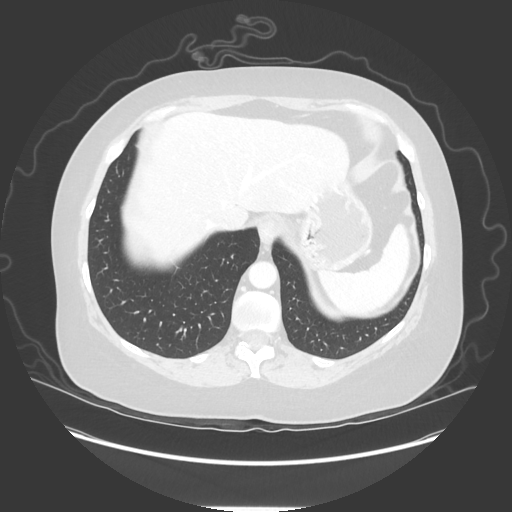

[Series 601: sagittal · sagittal · 0.84mm/px · 6 of 208 slices shown]
[im 19/208  soft-tissue]
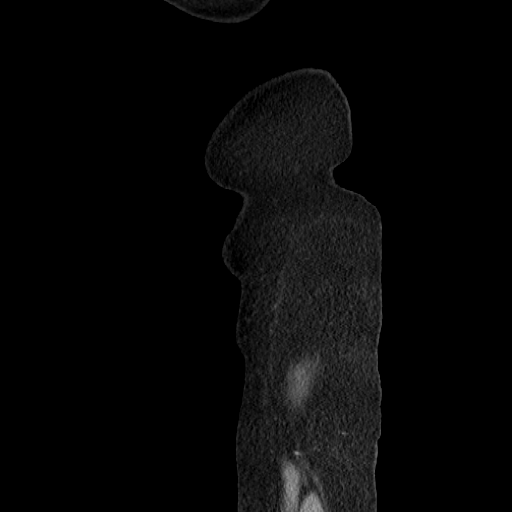
[im 38/208  soft-tissue]
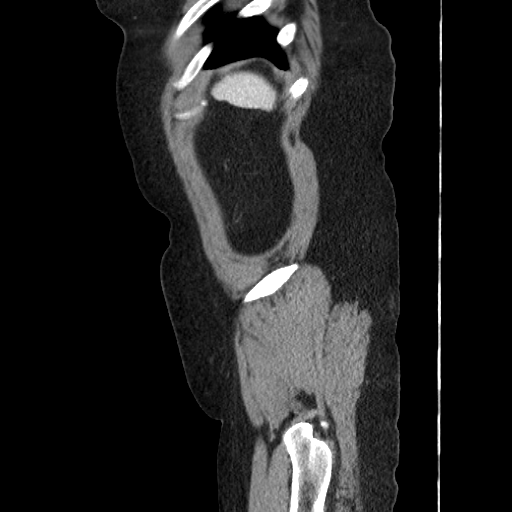
[im 76/208  soft-tissue]
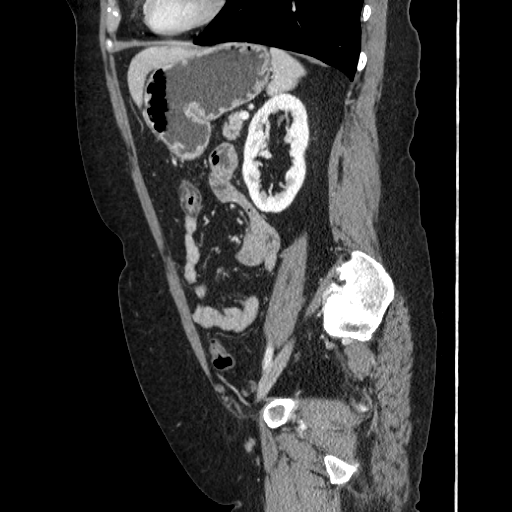
[im 95/208  soft-tissue]
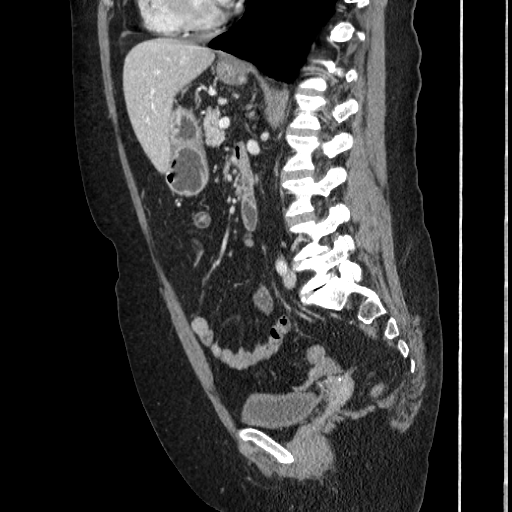
[im 113/208  soft-tissue]
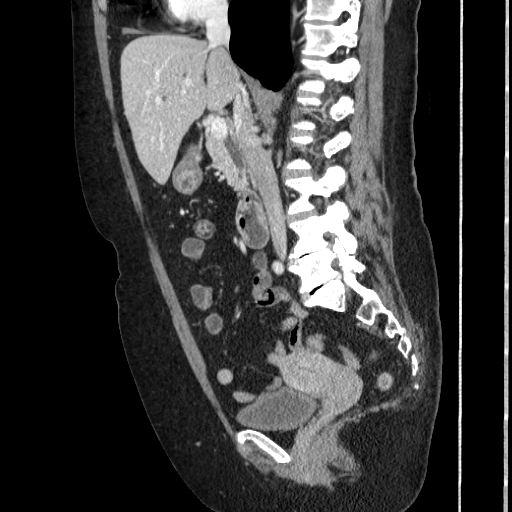
[im 132/208  soft-tissue]
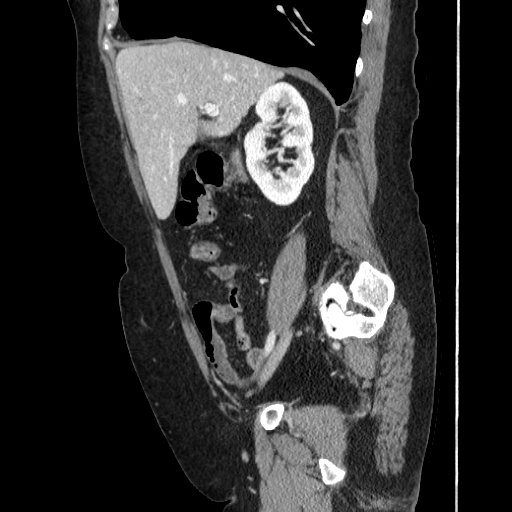

[14 of 32 positions shown; findings below may reference images not displayed]

FINDINGS: The lung bases are clear.
There is a small hypodensity in the left  lobe of liver,  which is too small to characterize.  The gallbladder is surgically absent with mild biliary dilatation. There is 1 mm nonobstructing calculus in the right kidney.  Small hypodense lesions are noted in both kidneys, which are too small to characterize.  There are surgical sutures in the adrenals bilaterally.
The pancreas and spleen are unremarkable.
There is submucosal fat deposition in the ascending, transverse and descending colon, likely related to prior inflammatory or infectious process.   Fluid filled small bowel loops, nonspecific. No evidence of bowel dilatation. The appendix is not visualized; however, there is no evidence of inflammatory process in the right lower quadrant.
The urinary bladder is not well distended with apparent wall thickening. Bilateral tubal ligation clips are noted. There is no free fluid or free air. Bilateral small fat-containing inguinal hernias are present. There is no  adenopathy.
Mild degenerative changes are identified in the spine.
IMPRESSION: 
IMPRESSION: No acute abnormality.
Other findings as described above
All CT scans at this facility use iterative reconstruction technique, dose modulation, and/or weight based dosing when appropriate to reduce radiation dose to as low as reasonably achievable.

## 2022-12-11 IMAGING — MR MRI HIP BILAT WITHOUT CONTRAST
6 of 8 series · 33 of 48 positions shown · non-contrast
Comparison: None available

FINAL REPORT:
HISTORY: Hip pain
Procedure: MRI of the bilateral hips
TECHNIQUE: Multisequence, multiplanar imaging of the bilateral hips was performed.

[Series 5: survey · axial · 8.0mm · 0.78mm/px · z∈[-237,+49]mm · 5 of 25 slices shown]
[im 1/25]
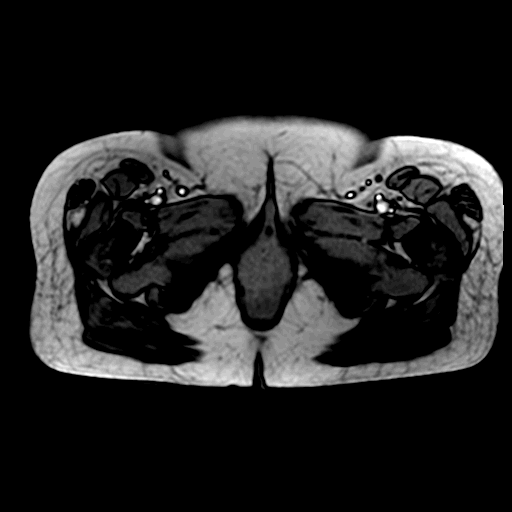
[im 5/25]
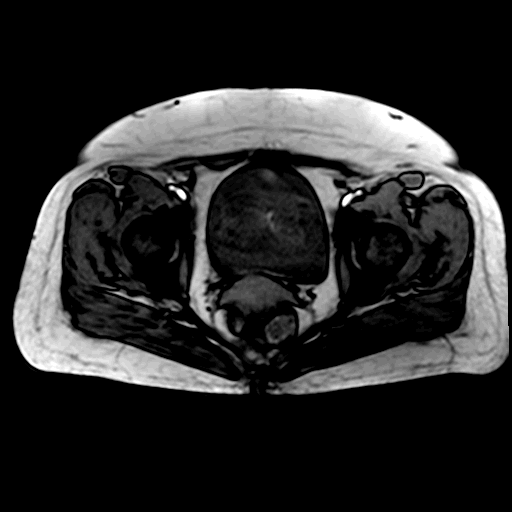
[im 10/25]
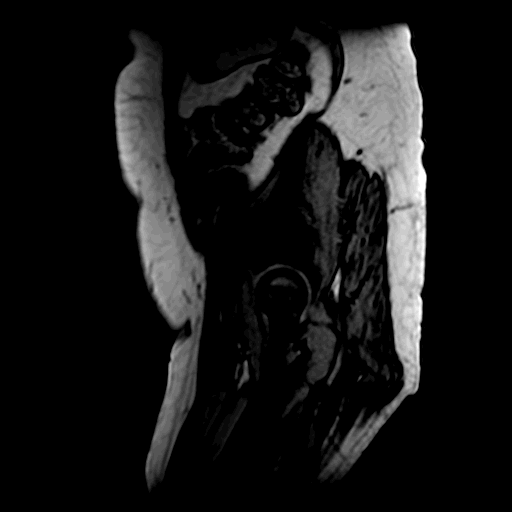
[im 15/25]
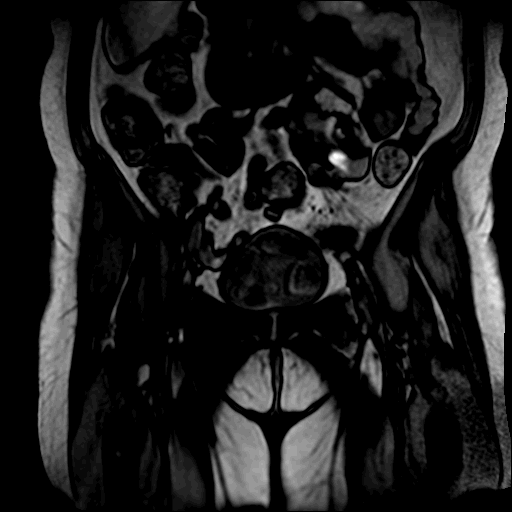
[im 20/25]
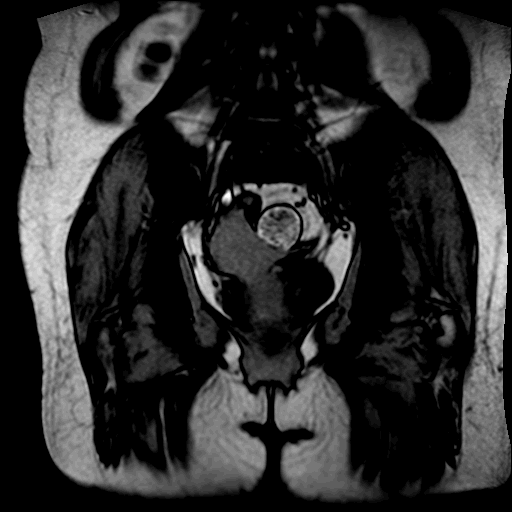

[Series 6: T1 · coronal · 5.0mm · 0.85mm/px · 6 of 30 slices shown]
[im 1/30]
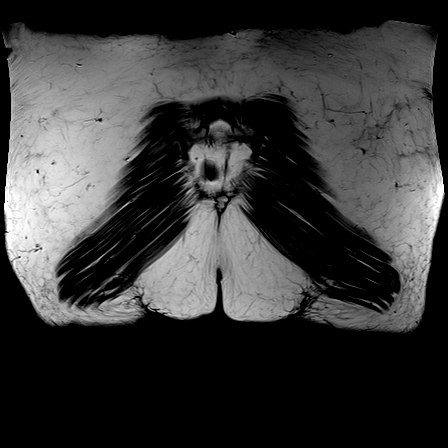
[im 6/30]
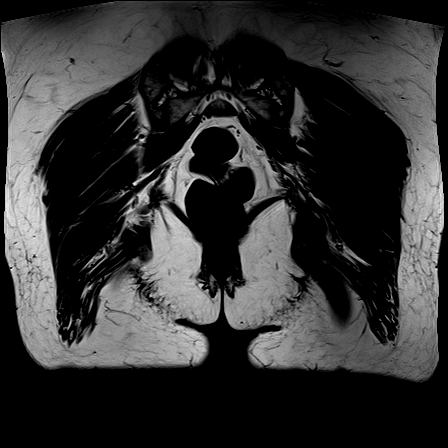
[im 12/30]
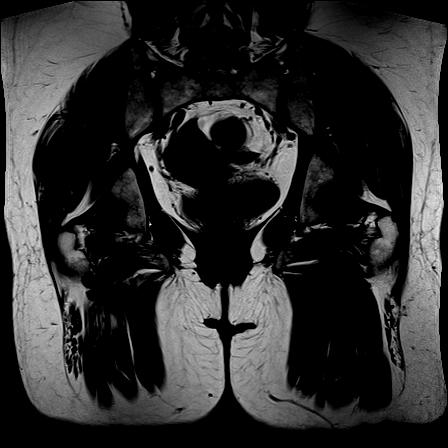
[im 18/30]
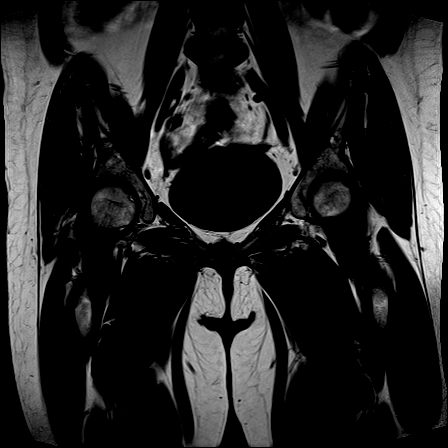
[im 24/30]
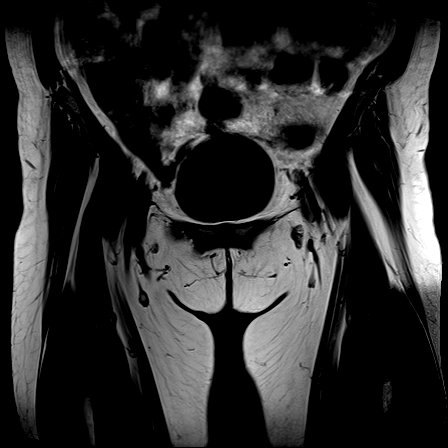
[im 30/30]
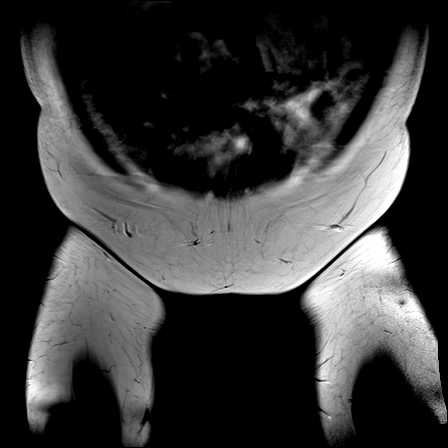

[Series 7: STIR · coronal · 5.0mm · 1.09mm/px · 6 of 30 slices shown]
[im 1/30]
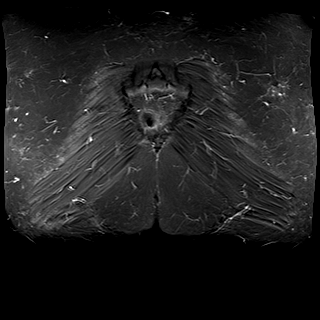
[im 6/30]
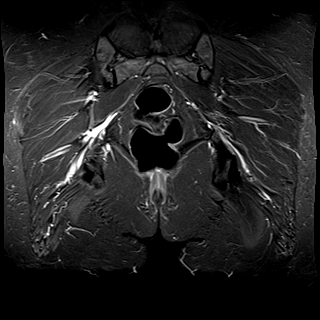
[im 12/30]
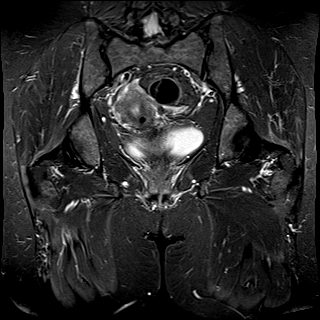
[im 18/30]
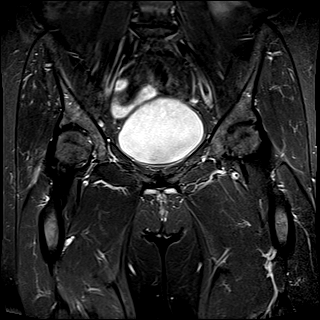
[im 24/30]
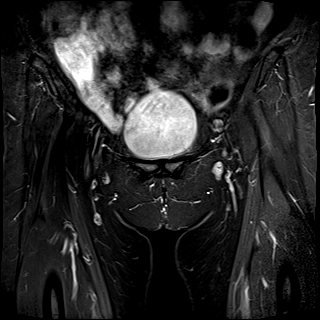
[im 30/30]
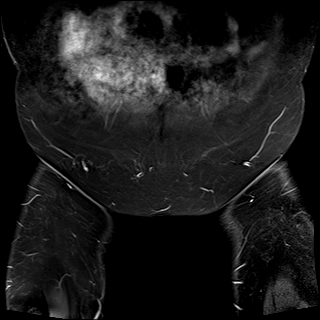

[Series 8: T2 fat-sat · axial · 4.0mm · 0.69mm/px · z∈[-274,-146]mm · 6 of 30 slices shown (1 of 3)]
[im 1/30]
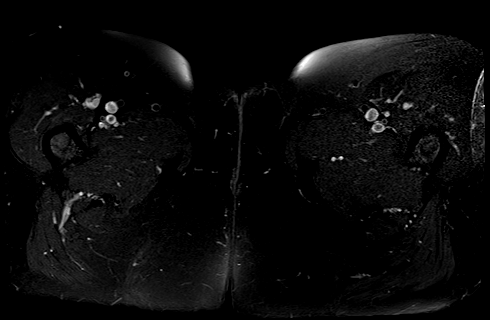
[im 6/30]
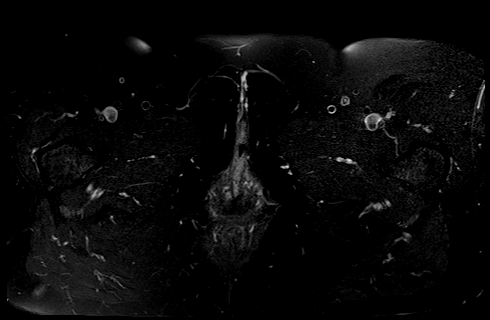
[im 12/30]
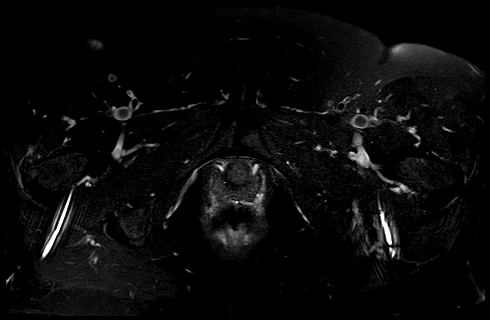
[im 18/30]
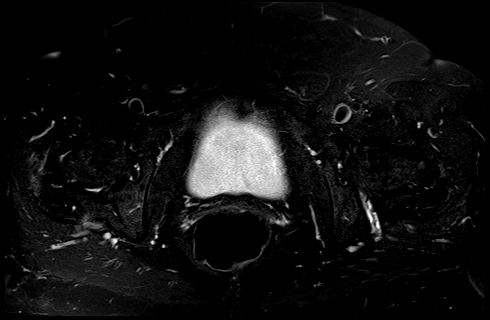
[im 24/30]
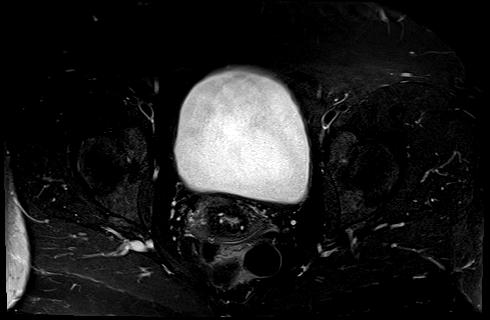
[im 30/30]
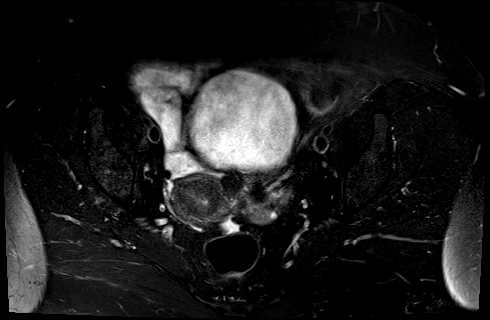

[Series 9: T2 fat-sat · coronal · 3.5mm · 0.70mm/px · 5 of 24 slices shown (2 of 3)]
[im 1/24]
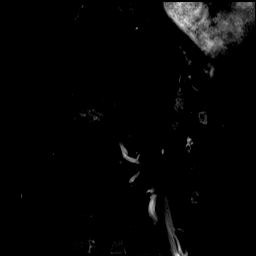
[im 6/24]
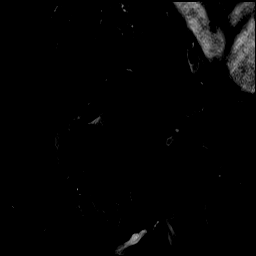
[im 12/24]
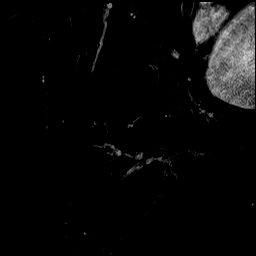
[im 18/24]
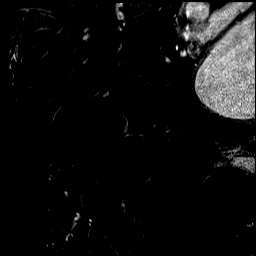
[im 24/24]
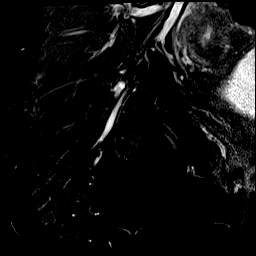

[Series 10: T2 fat-sat · coronal · 3.5mm · 0.70mm/px · 5 of 24 slices shown (3 of 3)]
[im 1/24]
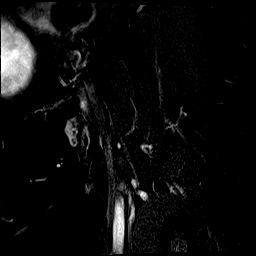
[im 6/24]
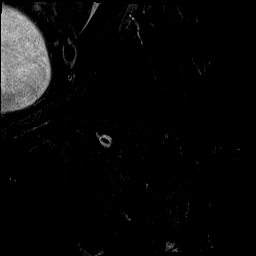
[im 12/24]
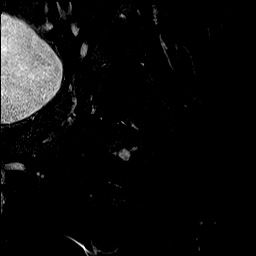
[im 18/24]
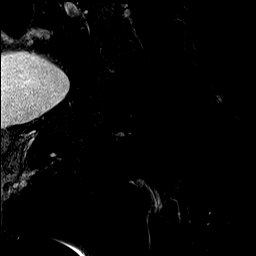
[im 24/24]
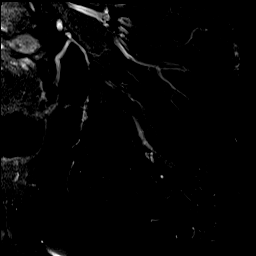

[33 of 48 positions shown; findings below may reference images not displayed]

FINDINGS: Femoral heads are symmetric. No fracture or osteonecrosis. Lower lumbar degenerative disc disease is seen at L4-5 and L5-S1. No aggressive marrow signal abnormality. Mild bilateral trochanteric bursitis. On the right, there is edema-like signal around the gluteus minimus. Trochanteric bursitis noted. There is increased signal at the chondral labral junction. This is seen on image 9 of series 9. The soft tissues are otherwise unremarkable. On the left, there is minimal edema-like signal around the greater trochanter. Muscles and tendons are unremarkable. No joint effusion. No acute fracture or destructive bone lesion.
IMPRESSION: 
IMPRESSION: 1. Bilateral hip trochanteric bursitis, right greater than left
2. Gluteus minimus peritendinopathy in the right hip.
3. Increased signal of the right chondrolabral junction. Labral tear cannot be excluded. If clinically indicated consider MRI arthrogram of the right hip
Is the patient pregnant?
No

## 2022-12-11 IMAGING — MR MRI LUMBAR SPINE WITHOUT CONTRAST
5 of 7 series · 40 of 48 positions shown · non-contrast
Comparison: none

FINAL REPORT:
MRI of the lumbar spine without contrast
HISTORY: Back pain
TECHNIQUE: Multiplanar and multisequence images were acquired through the lumbar spine without contrast.

[Series 6: T2 · sagittal · 4.0mm · 0.62mm/px · 5 of 17 slices shown (1 of 2)]
[im 1/17]
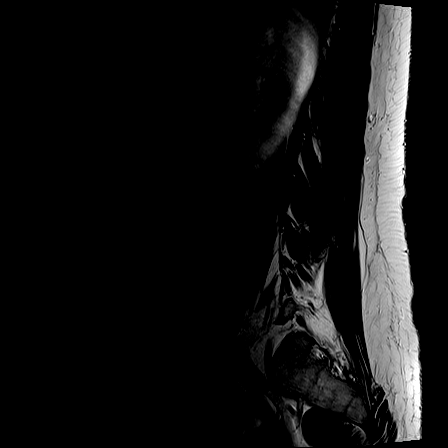
[im 5/17]
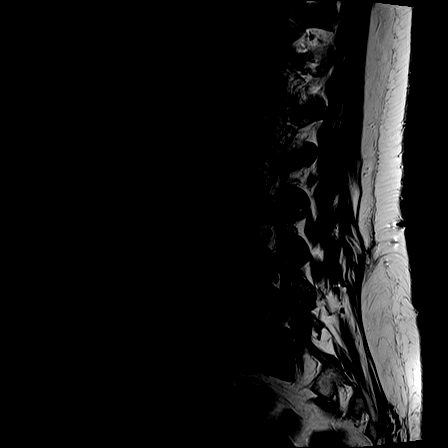
[im 9/17]
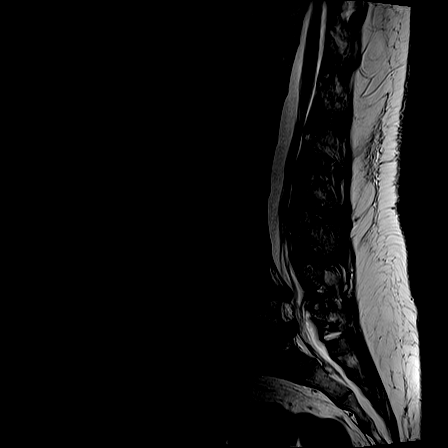
[im 13/17]
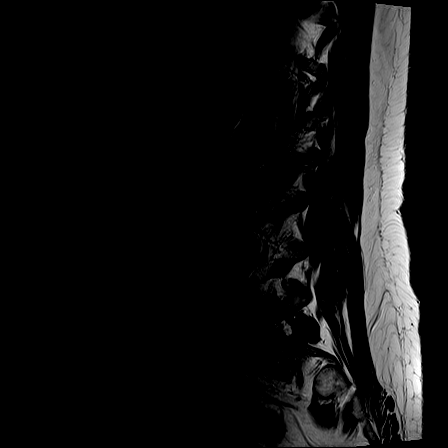
[im 17/17]
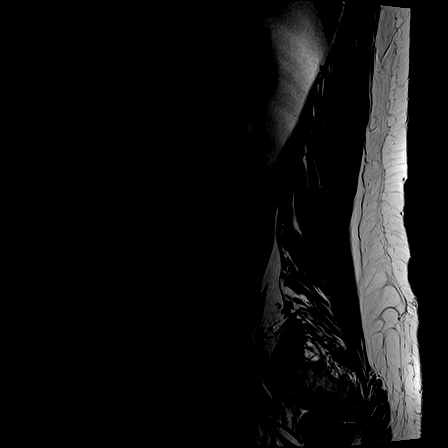

[Series 7: STIR · sagittal · 4.0mm · 0.88mm/px · 5 of 17 slices shown]
[im 1/17]
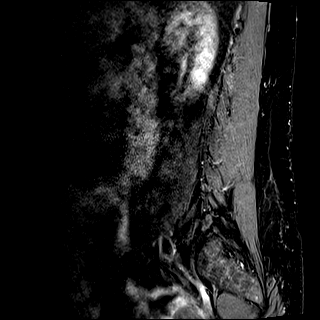
[im 5/17]
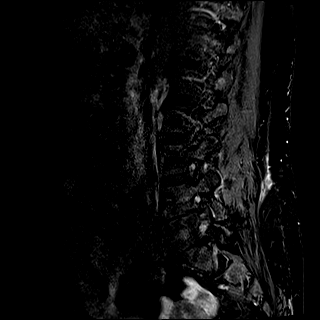
[im 9/17]
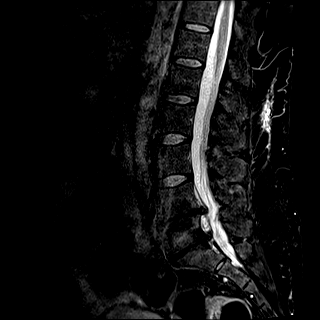
[im 13/17]
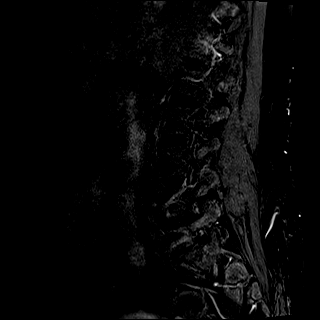
[im 17/17]
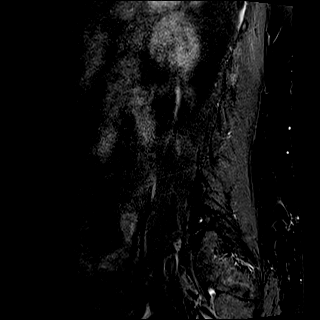

[Series 8: T1 · sagittal · 4.0mm · 0.73mm/px · 5 of 17 slices shown (1 of 2)]
[im 1/17]
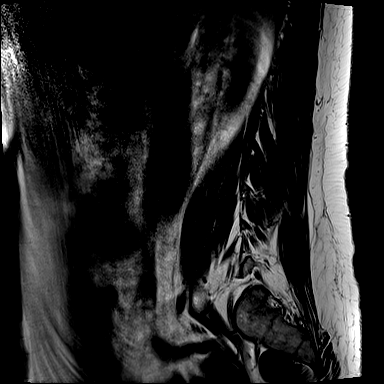
[im 5/17]
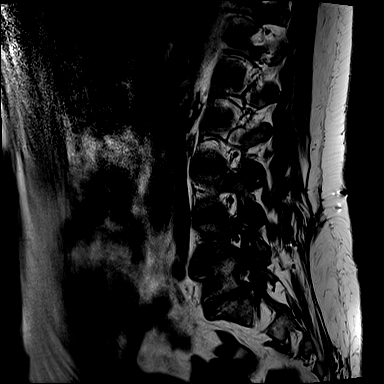
[im 9/17]
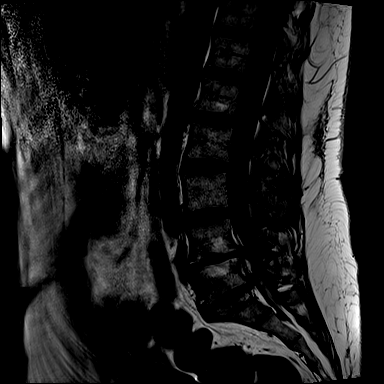
[im 13/17]
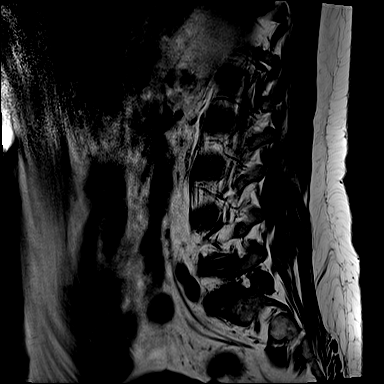
[im 17/17]
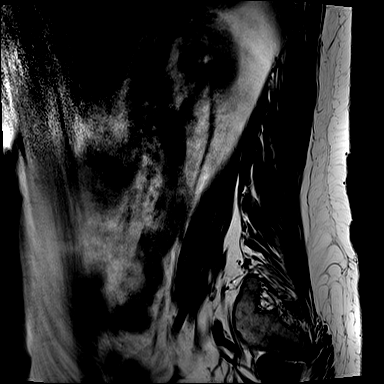

[Series 11: T2 · axial · 4.0mm · 0.69mm/px · z∈[-133,+121]mm · 13 of 45 slices shown (2 of 2)]
[im 1/45]
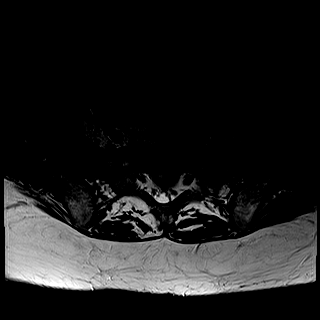
[im 4/45]
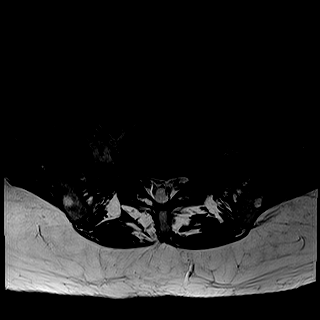
[im 8/45]
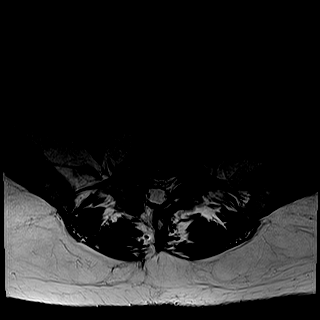
[im 12/45]
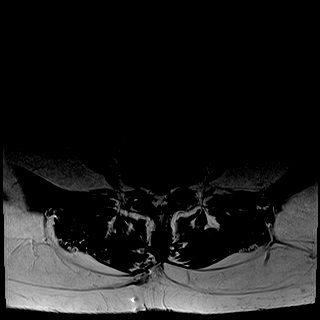
[im 15/45]
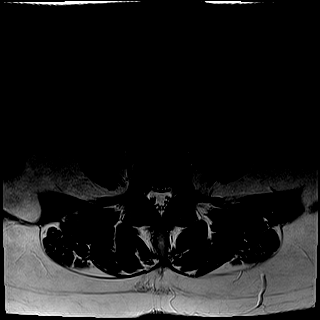
[im 19/45]
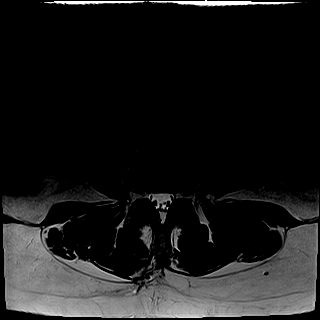
[im 23/45]
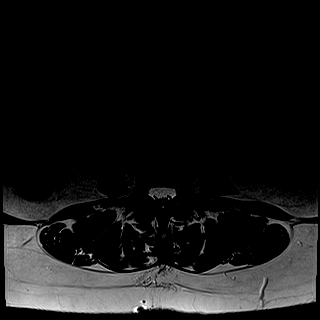
[im 26/45]
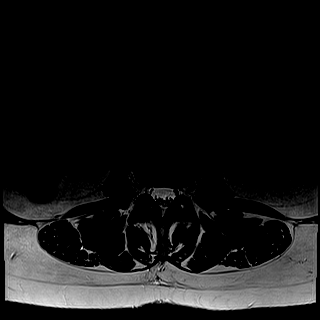
[im 30/45]
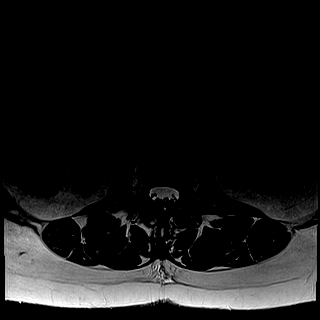
[im 34/45]
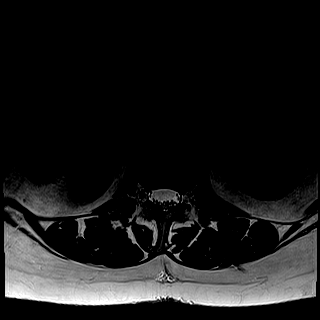
[im 37/45]
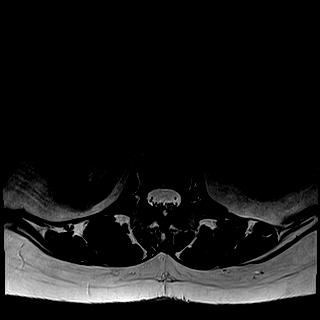
[im 41/45]
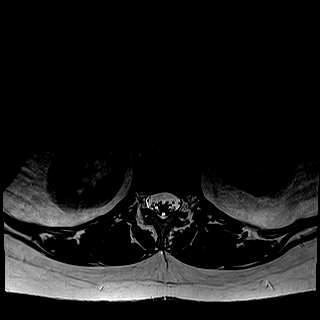
[im 45/45]
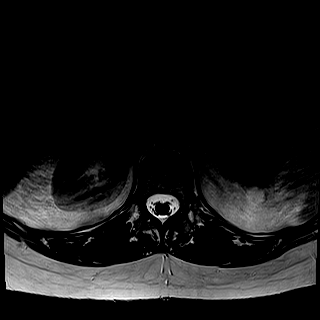

[Series 14: T1 · axial · 4.0mm · 0.69mm/px · z∈[-133,+121]mm · 12 of 45 slices shown (2 of 2)]
[im 1/45]
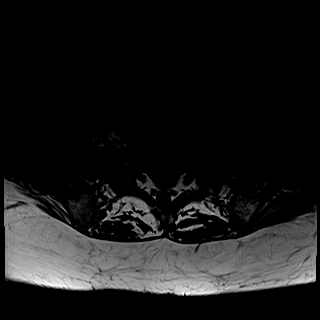
[im 4/45]
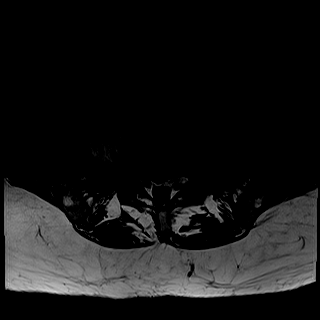
[im 8/45]
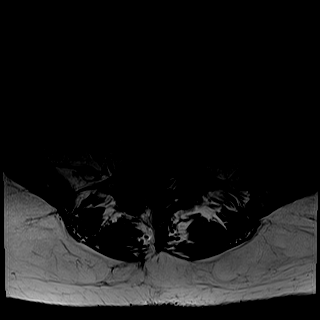
[im 12/45]
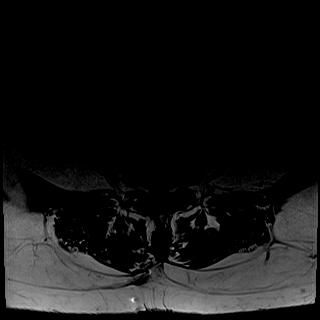
[im 15/45]
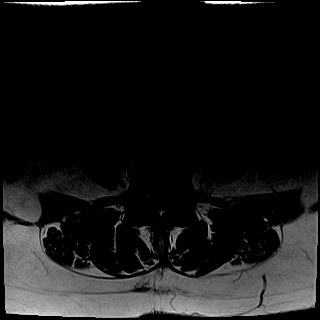
[im 19/45]
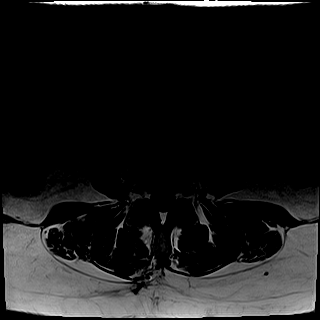
[im 23/45]
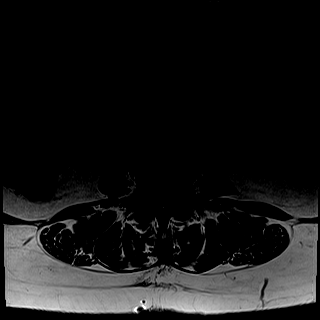
[im 26/45]
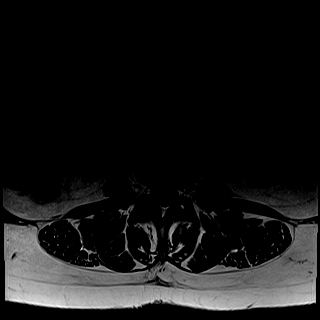
[im 30/45]
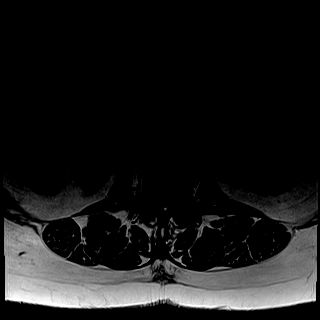
[im 34/45]
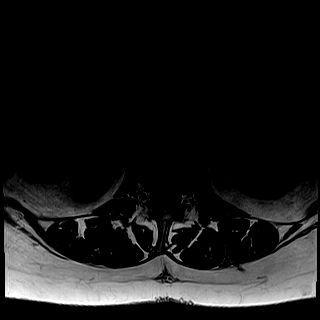
[im 37/45]
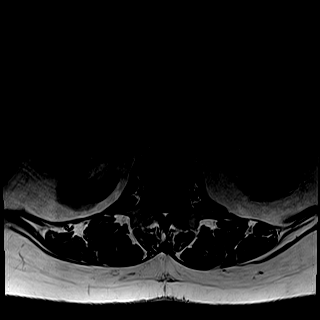
[im 45/45]
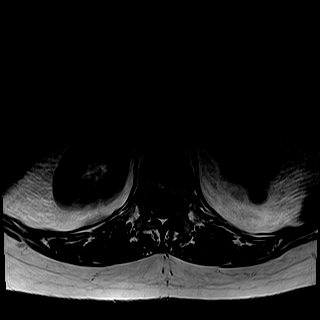

[40 of 48 positions shown; findings below may reference images not displayed]

FINDINGS: Conus medullaris terminates at T12-L1. The lumbar spine is in anatomic alignment. The lumbar vertebral body heights are preserved. No pars defect. There are type II Modic endplate changes at L4-5 and L5-S1. The imaged paravertebral soft tissue structures are grossly unremarkable.
At L1-2 there is no significant spinal canal or neural foraminal stenosis.
At L2-3 there is mild facet hypertrophy without significant spinal canal or neural foraminal stenosis.
At L3-4 there is mild-to-moderate facet hypertrophy without significant spinal canal or neural foraminal stenosis.
At L4-5 there is a central disc extrusion with moderate facet hypertrophy partially effacing the thecal sac and causing mild-to-moderate spinal canal stenosis. There is mild bilateral neural foraminal stenosis.
At L5-S1 there is a left paracentral disc protrusion slightly displacing the descending left S1 nerve root. There is moderate facet hypertrophy with overall no significant spinal canal stenosis. There is moderately severe left and moderate right neural foraminal stenosis.
IMPRESSION: 1. Mild/moderate spinal canal stenosis at L4-5.
2. Left paracentral disc protrusion at L5-S1 slightly displacing the descending left S1 nerve root. Moderately severe left and moderate right neural foraminal stenosis.
Is the patient pregnant?
No

## 2023-08-20 IMAGING — DX XR PELVIS 1-2 VIEWS
1 series · 1 of 1 positions shown · non-contrast
Comparison: none

FINAL REPORT:
INDICATION: falls, right hip pain pain
Exam: XR PELVIS 1-2 VIEWS

[AP]
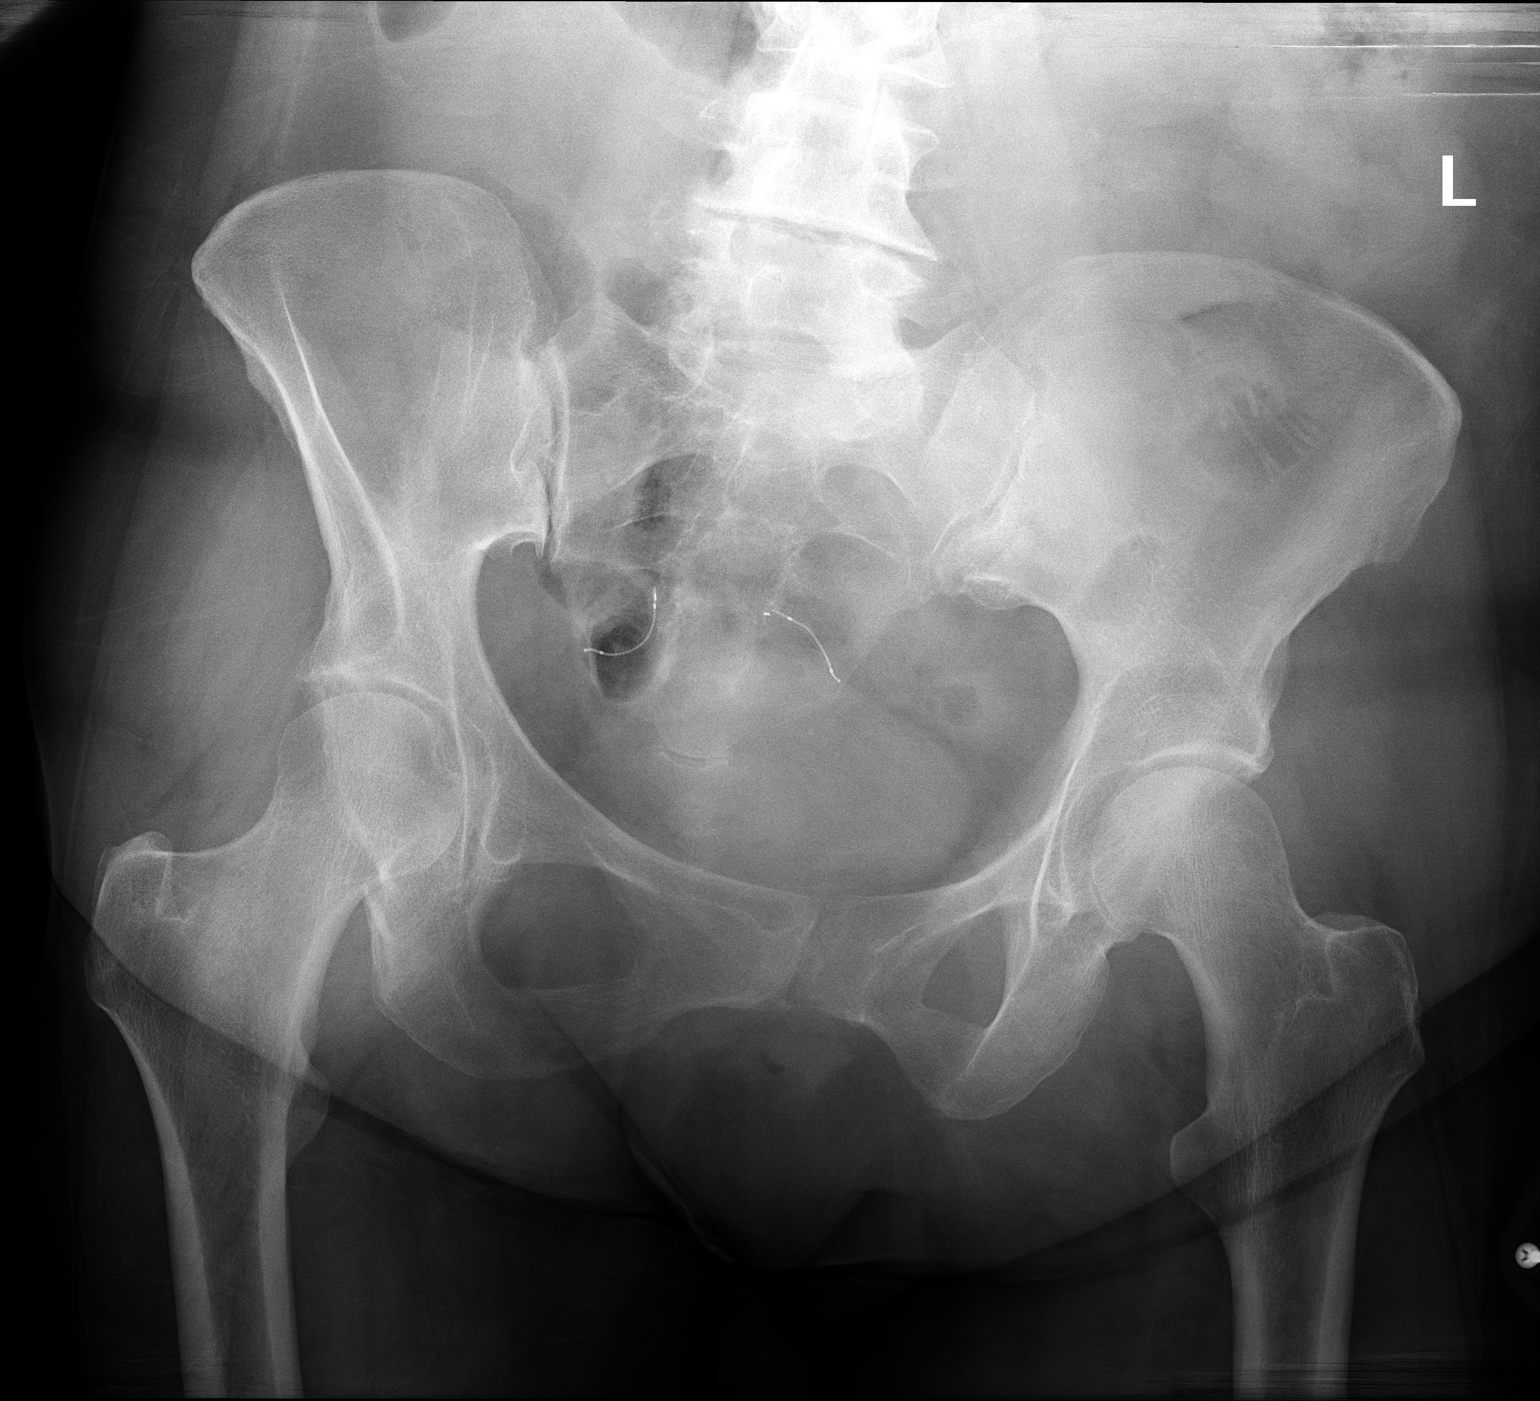

[1 of 1 positions shown; findings below may reference images not displayed]

FINDINGS: 1 pelvis view shows no evidence of acute fracture or subluxation.  Joint spaces are fairly well-maintained. No suspicious radiopaque foreign body. Tubal occlusion devices.
IMPRESSION: 
IMPRESSION: No significant abnormality.
Is the patient pregnant?
No

## 2023-08-20 IMAGING — DX XR FEMUR 2+ VW RIGHT
1 series · 4 of 4 positions shown · non-contrast
Comparison: none

FINAL REPORT:
INDICATION: falls, right hip pain pain
Exam: XR FEMUR 2+ VW RIGHT

[Series 138: AP · right · 0.10mm/px · 4 of 4 slices shown]
[im 1/4]
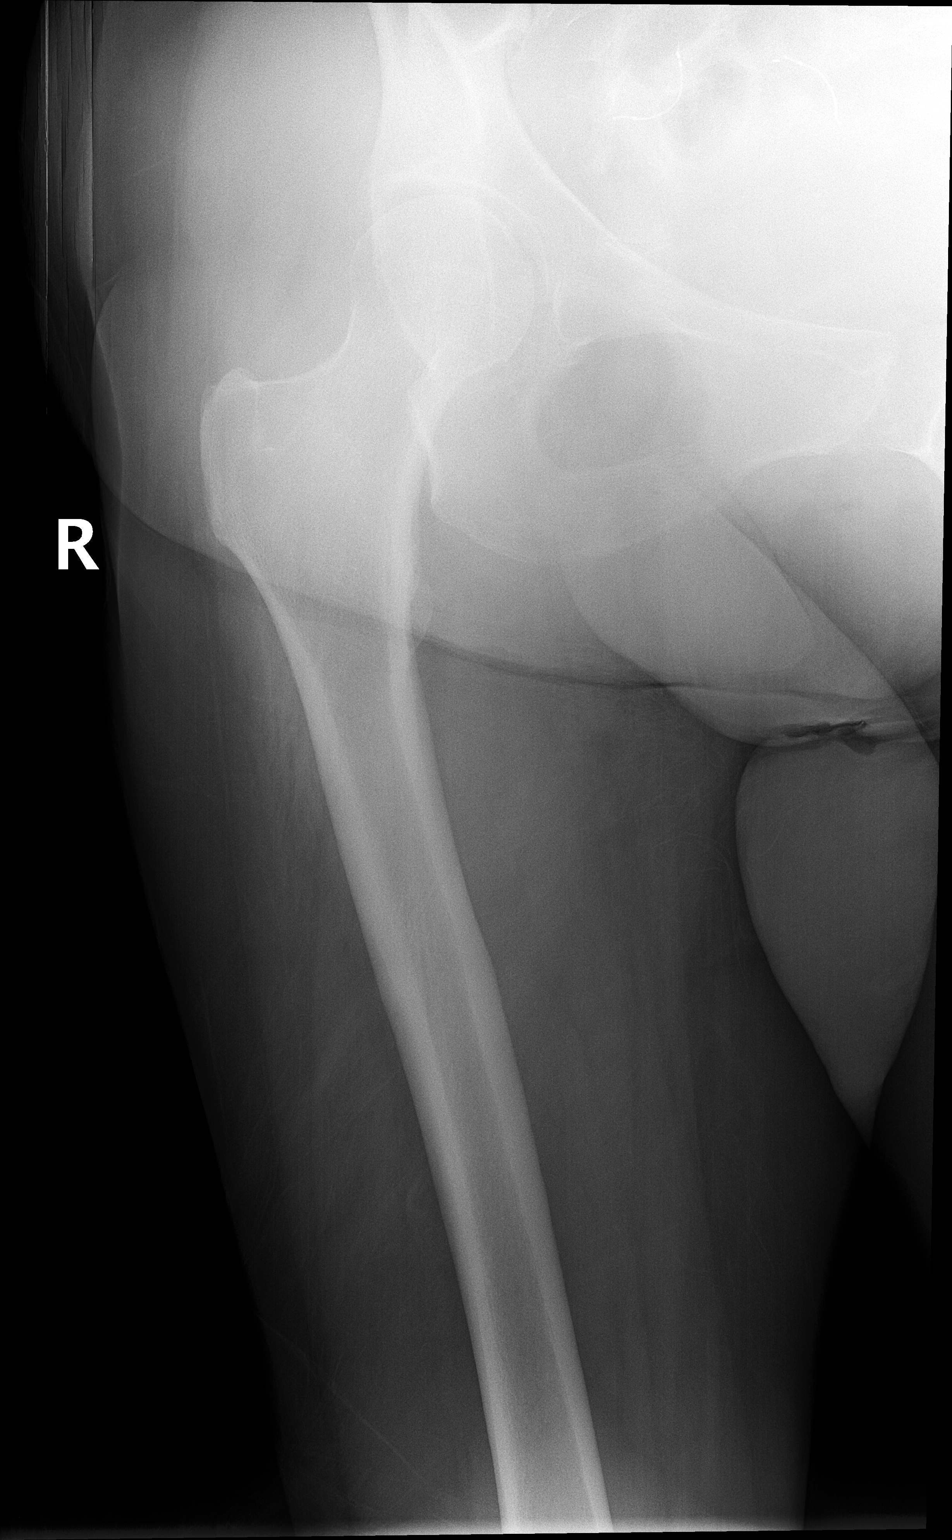
[im 2/4]
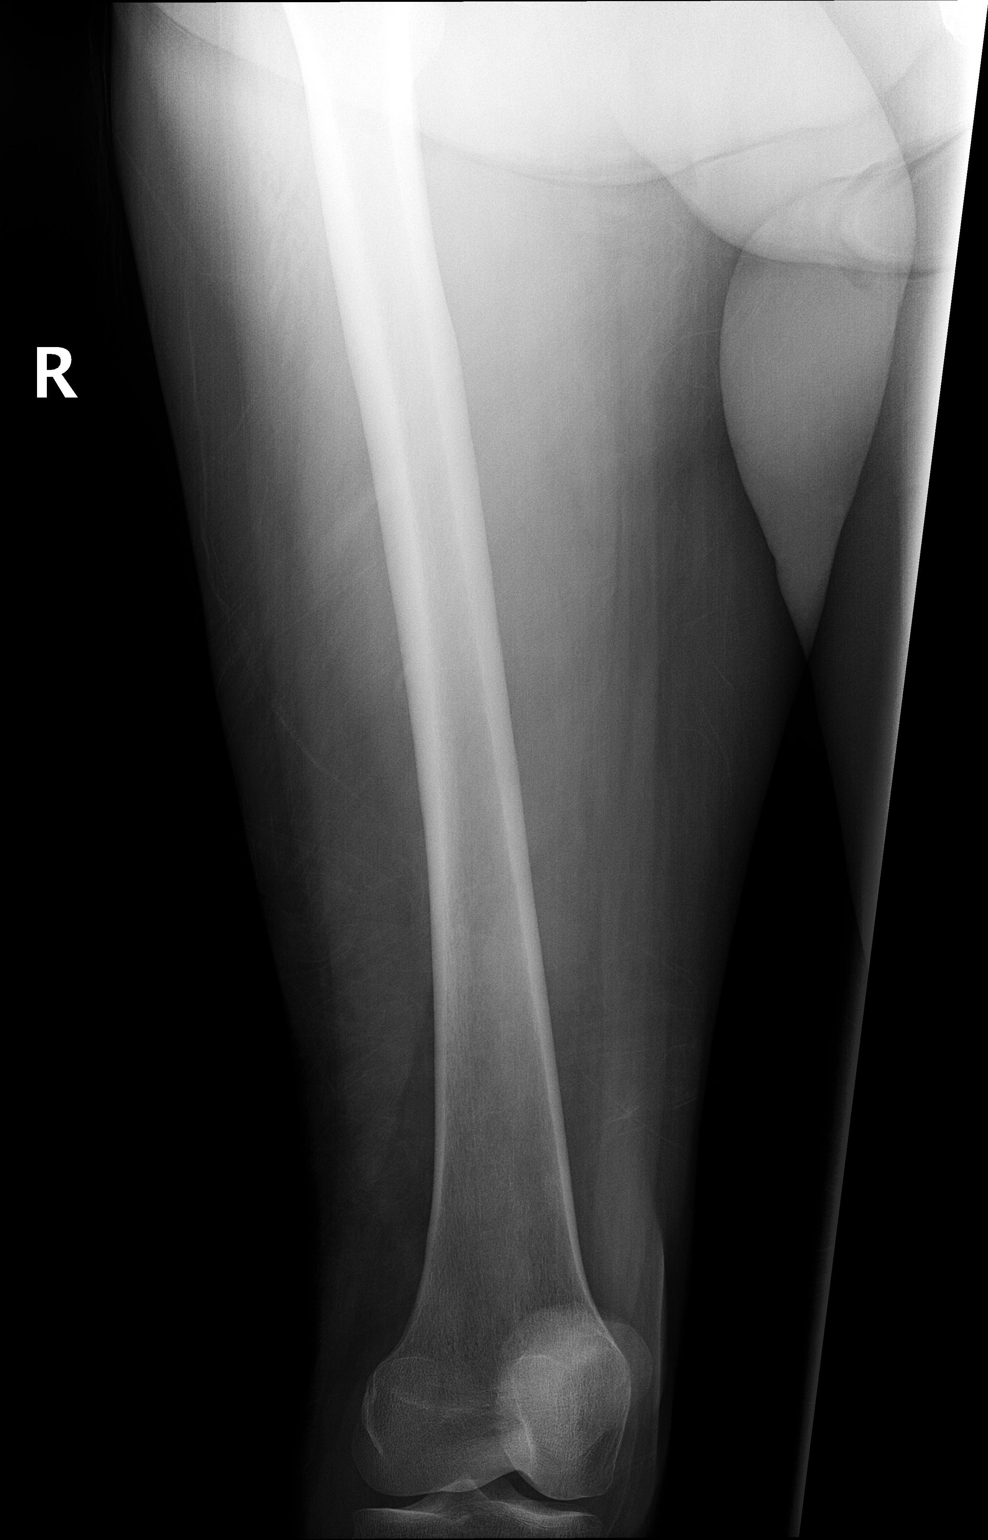
[im 3/4]
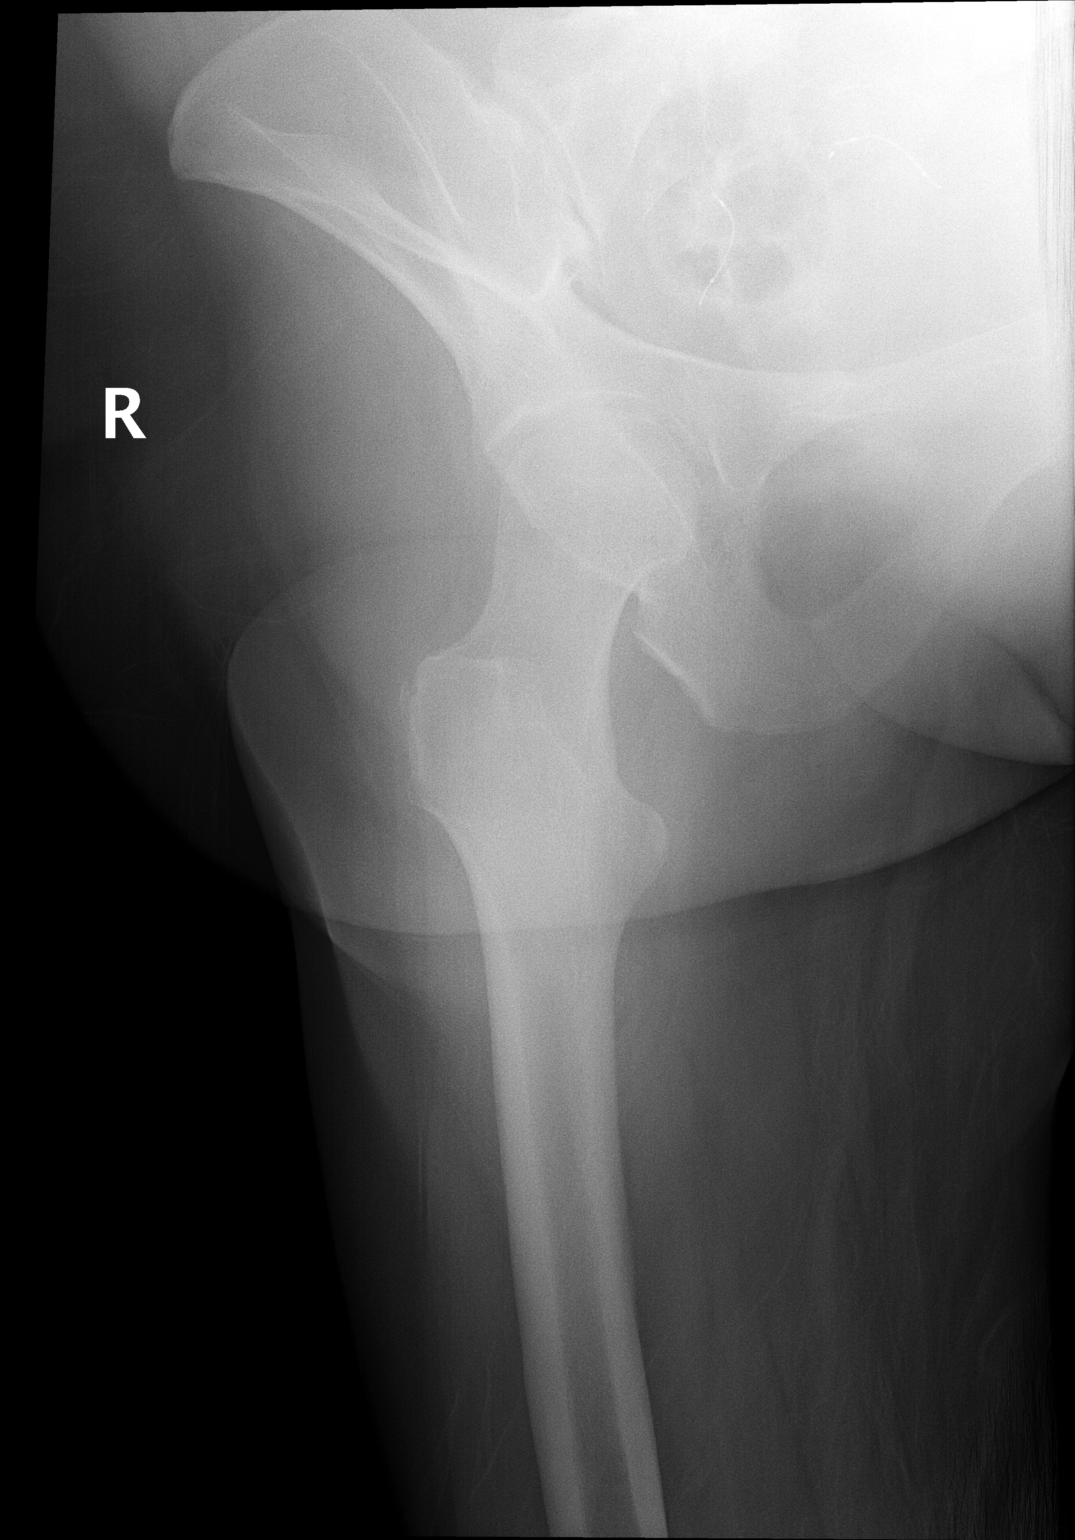
[im 4/4]
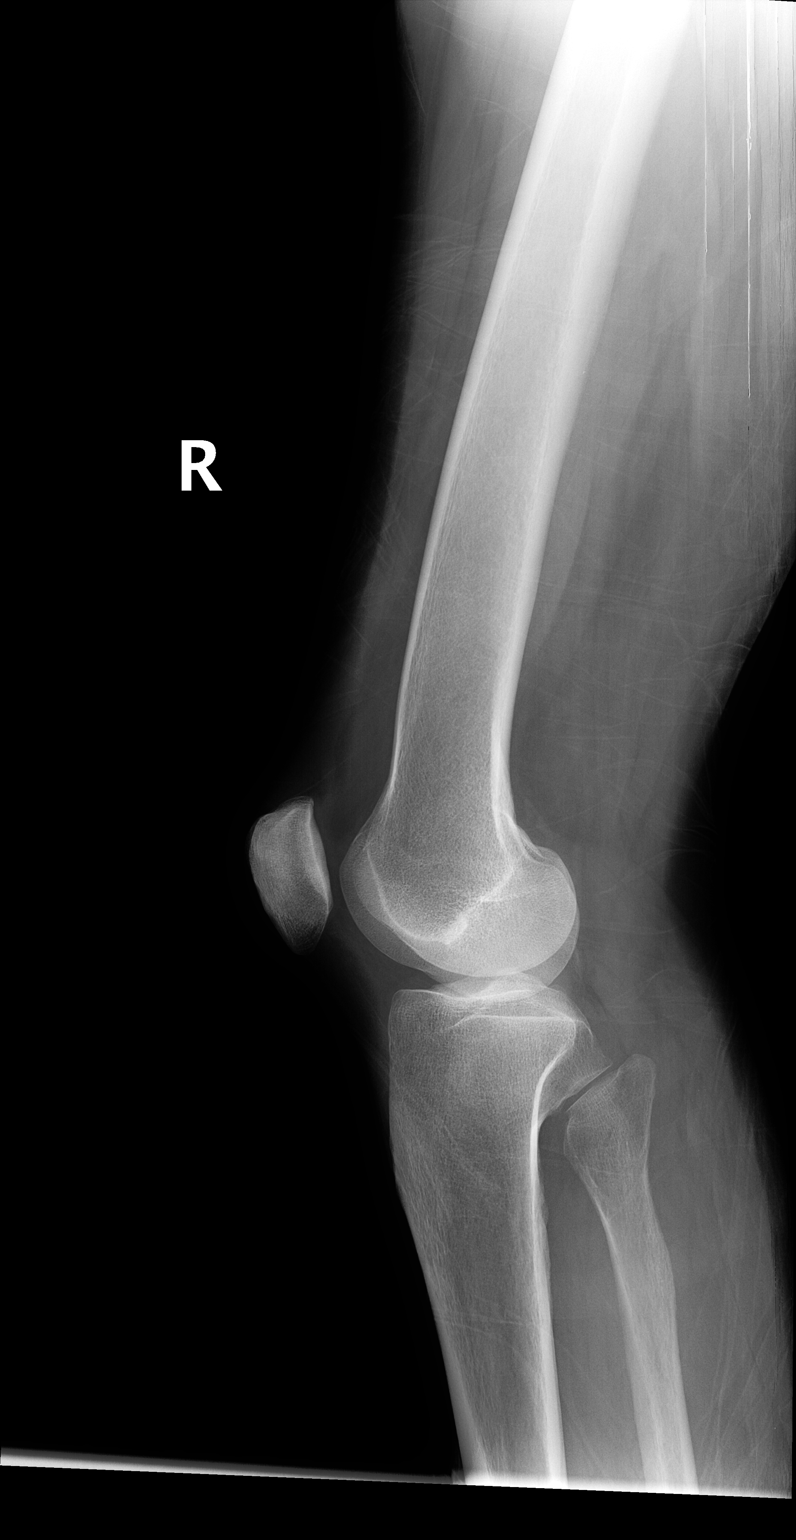

[4 of 4 positions shown; findings below may reference images not displayed]

FINDINGS: 2 views of the right femur show no evidence of acute fracture or subluxation.  Joint spaces are fairly well-maintained. No suspicious radiopaque foreign body.
IMPRESSION: 
IMPRESSION: No significant abnormality.
Is the patient pregnant?
No

## 2023-08-20 IMAGING — CR XR CHEST 1 VIEW
1 series · 1 of 1 positions shown · non-contrast
Comparison: Chest x-ray from 05/07/2022

FINAL REPORT:
INDICATION: left arm pain and left jaw pain
Exam: XR CHEST 1 VIEW

[AP]
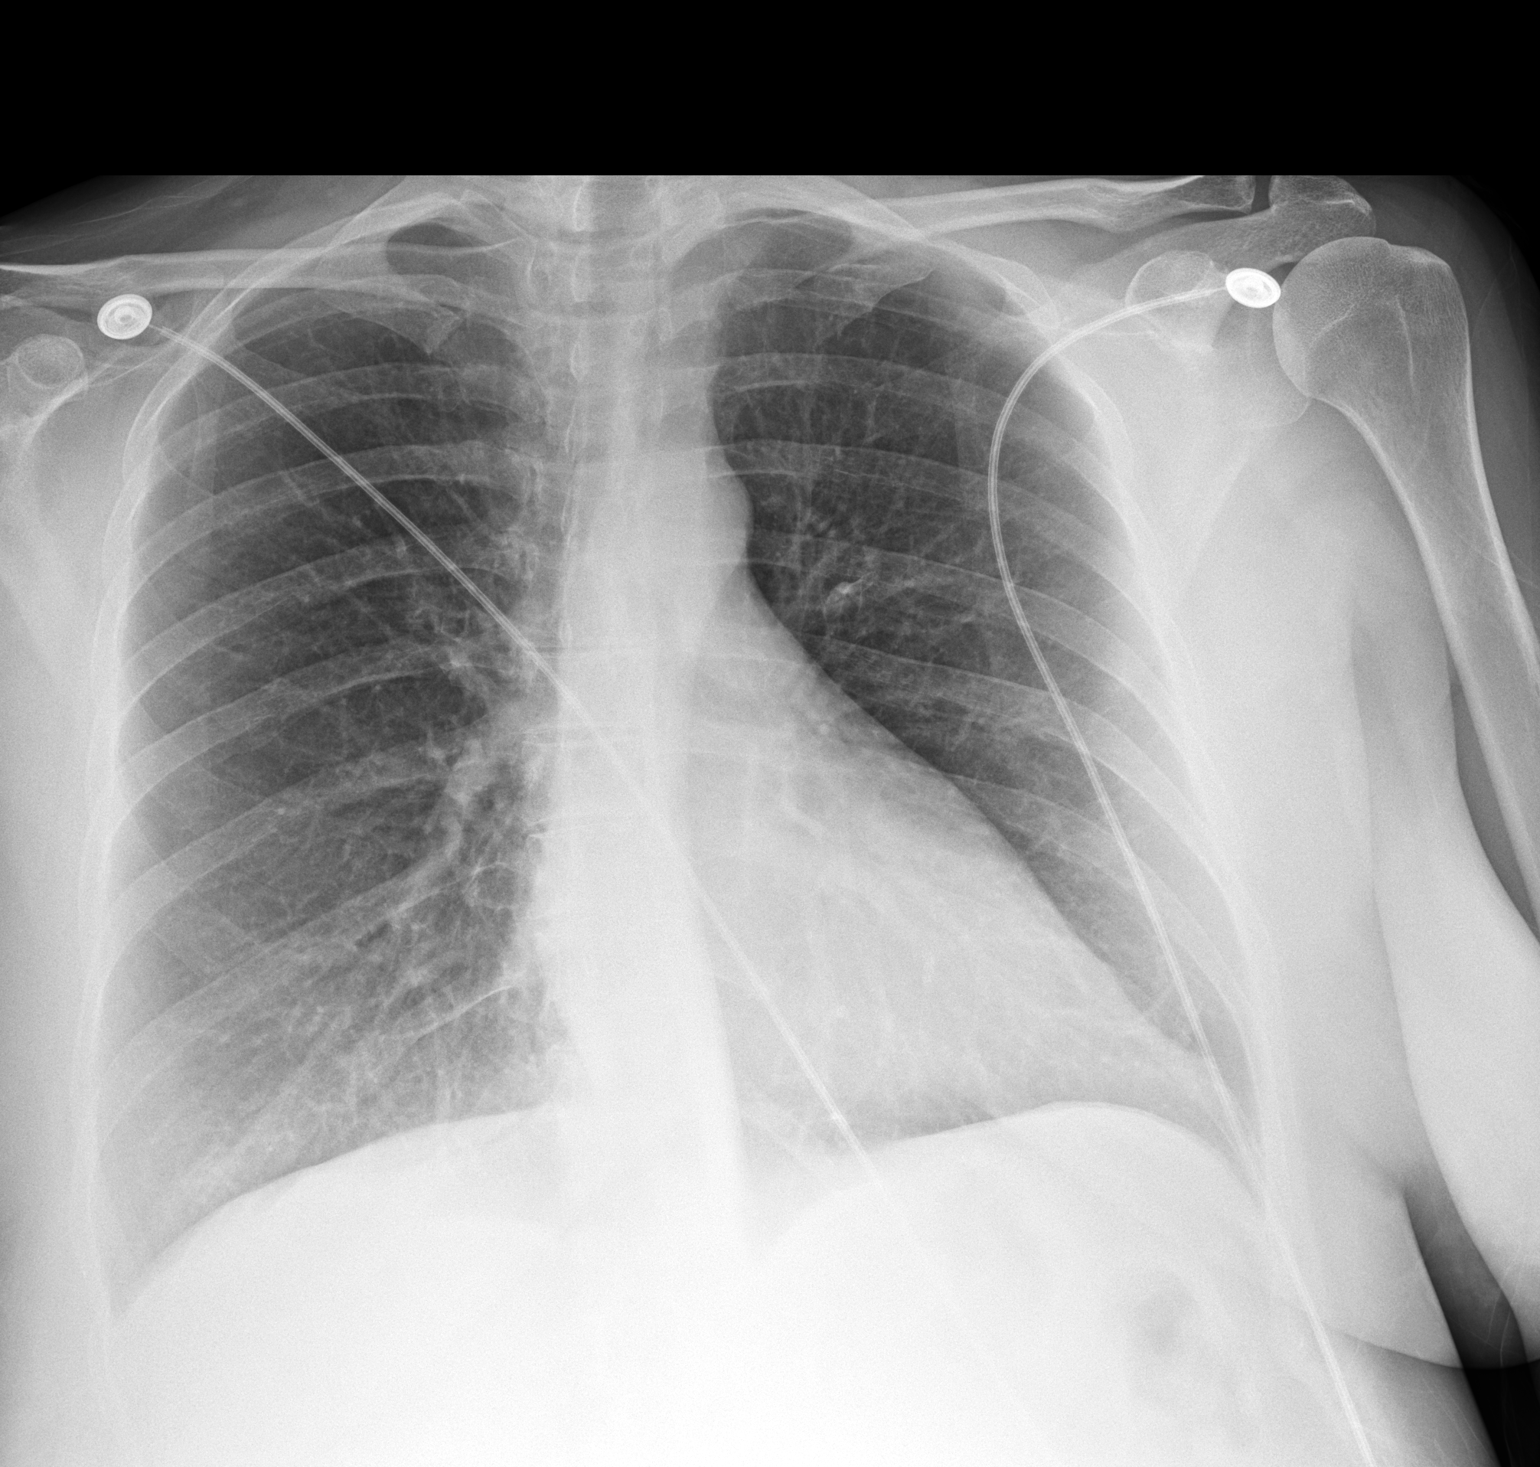

[1 of 1 positions shown; findings below may reference images not displayed]

FINDINGS: 1 view of the chest. The cardiomediastinal contours are within normal limits. No evidence of focal consolidation, pleural effusion, pulmonary edema, or pneumothorax.
IMPRESSION: 
IMPRESSION: No acute cardiopulmonary findings.
Is the patient pregnant?
No

## 2024-03-10 IMAGING — CT CT ABDOMEN PELVIS WITH CONTRAST
1 of 4 series · 10 of 32 positions shown, 16 images · IV contrast (agent unspecified)
Comparison: none

FINAL REPORT:
HISTORY: Generalized abdominal pain.
CT abdomen and pelvis with contrast.
TECHNIQUE: Informed consent obtained prior to administration of non-ionic intravenous contrast administered without adverse response. Axial CT imaging obtained from the lung bases to the pubic symphysis. Sagittal and coronal reformatted imaging obtained as well.

[Series 3: retro recon · axial · 0.75mm/px · z∈[-385,-48]mm · 10 of 332 slices shown, 16 images]
[im 31/332  soft-tissue]
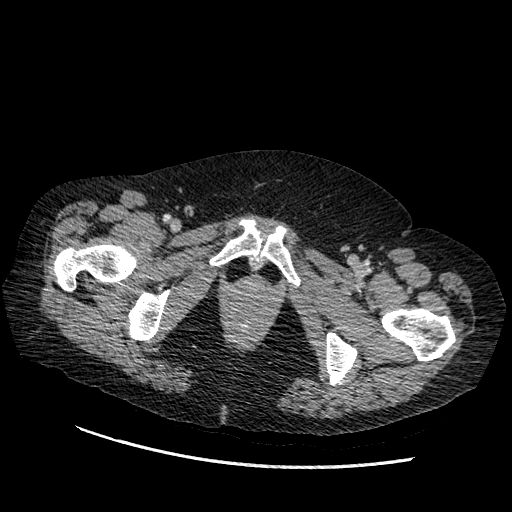
[im 31/332  bone]
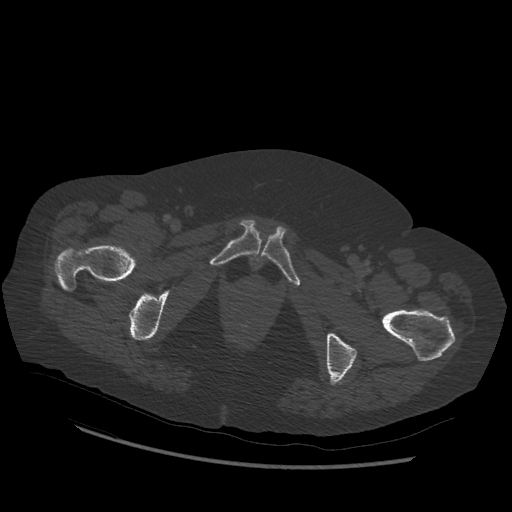
[im 61/332  soft-tissue]
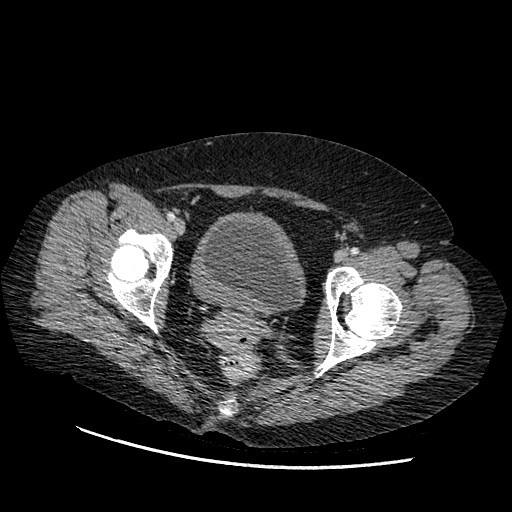
[im 91/332  soft-tissue]
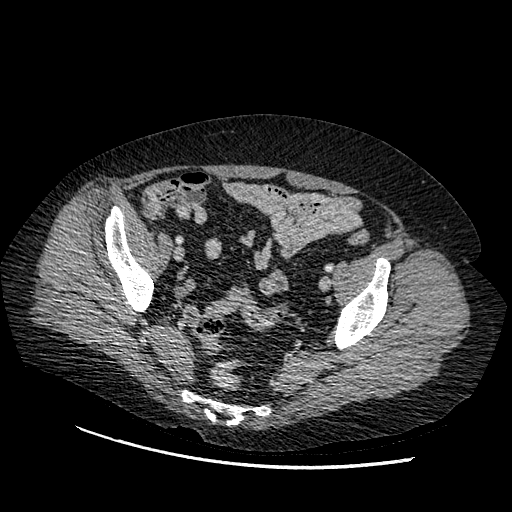
[im 121/332  soft-tissue]
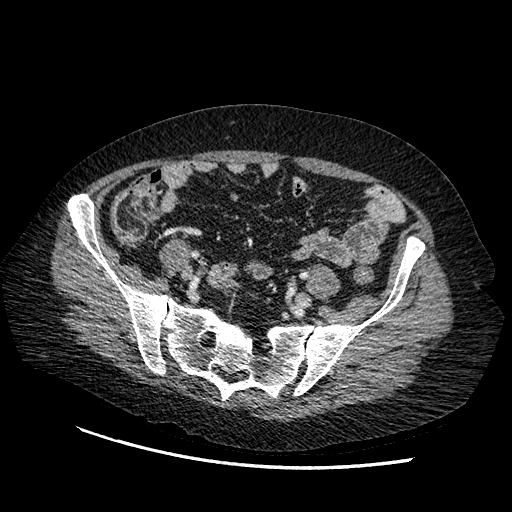
[im 151/332  soft-tissue]
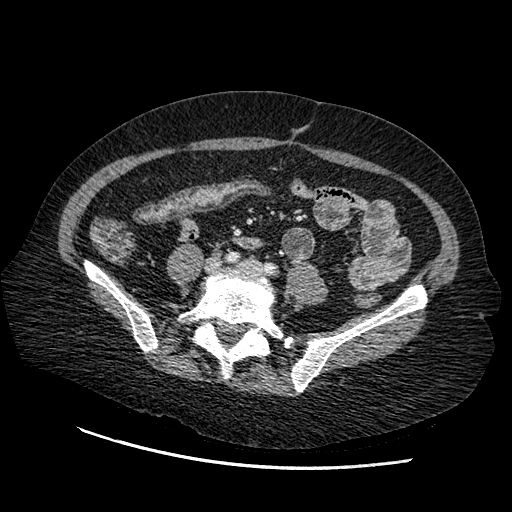
[im 181/332  soft-tissue]
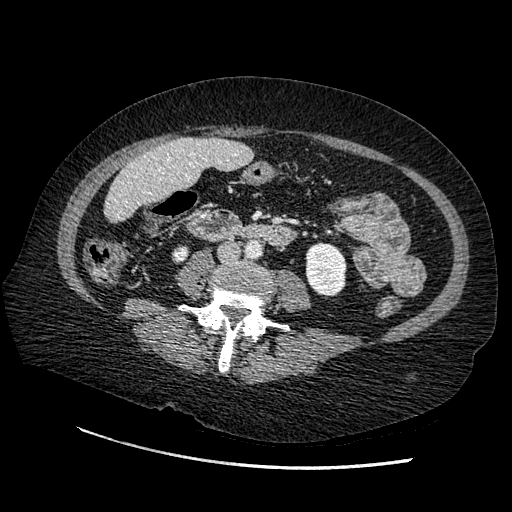
[im 211/332  soft-tissue]
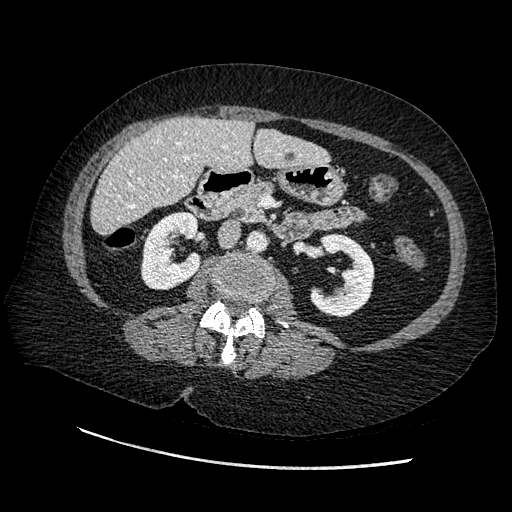
[im 211/332  lung]
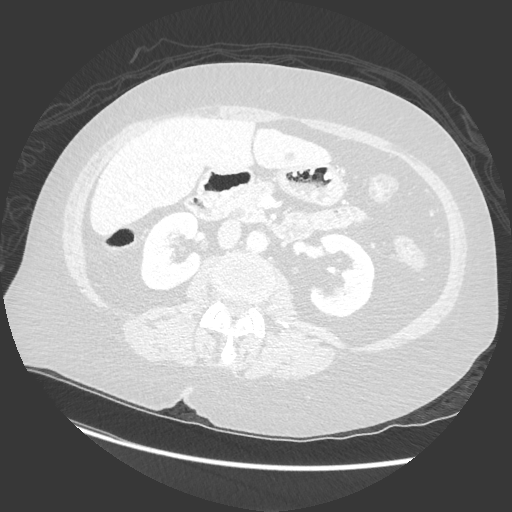
[im 241/332  soft-tissue]
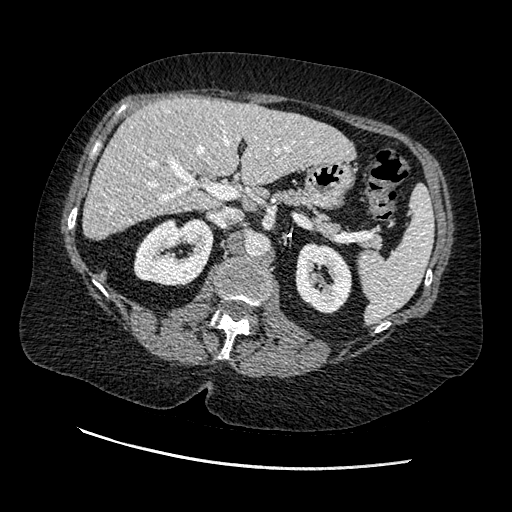
[im 241/332  lung]
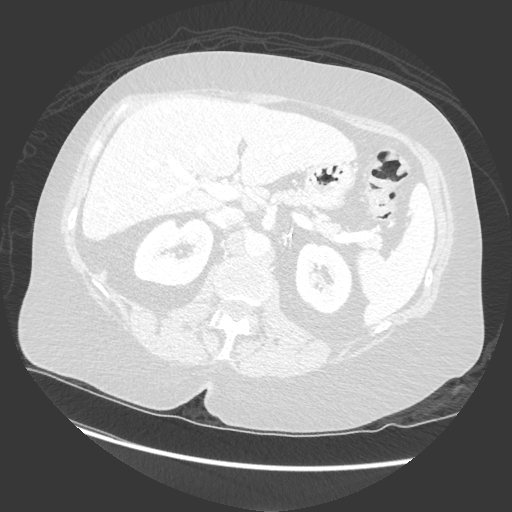
[im 271/332  soft-tissue]
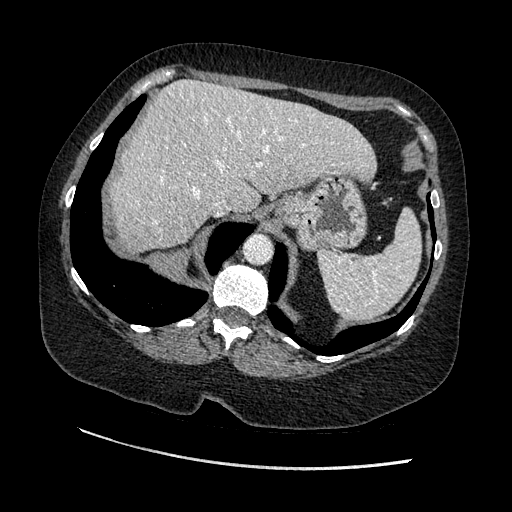
[im 271/332  lung]
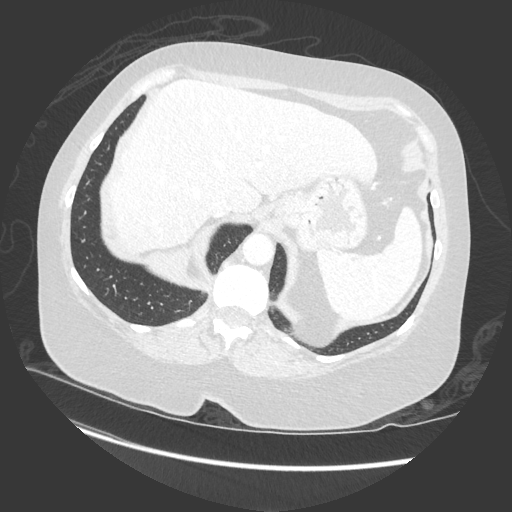
[im 271/332  bone]
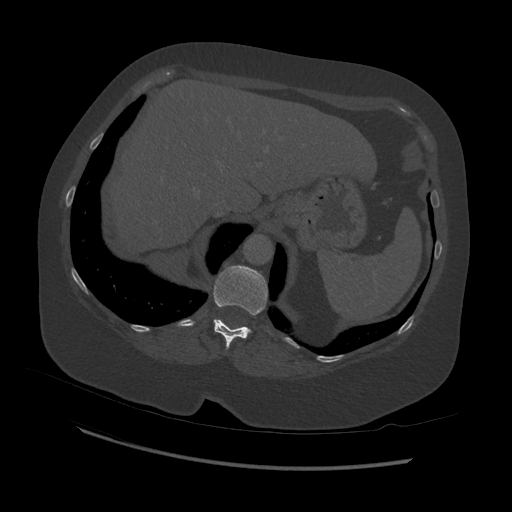
[im 301/332  soft-tissue]
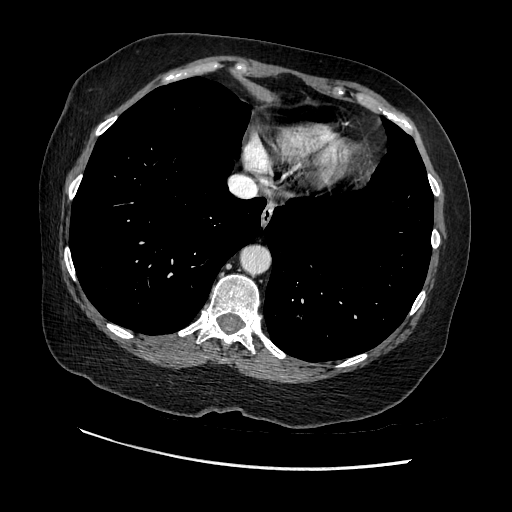
[im 301/332  lung]
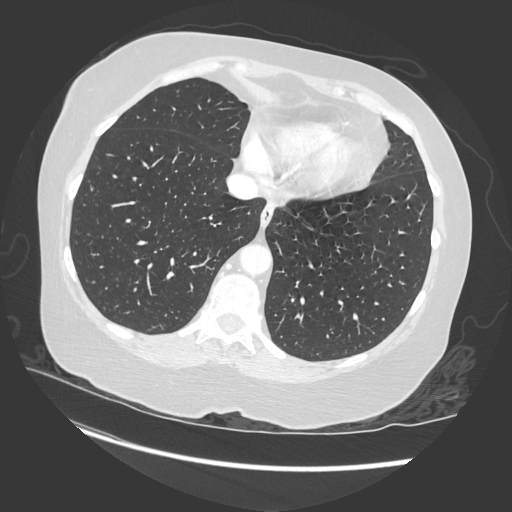

[10 of 32 positions shown; findings below may reference images not displayed]

FINDINGS: Prior: CT abdomen/pelvis 09/09/2022.
The lung bases are clear.
ABDOMEN:
Comment: Unless the patient's specific circumstances suggest otherwise, any liver lesions 0.5 cm or less, any benign cystic kidney lesions (like Bosniak 1 or 2), and/or any adrenal lesion 1.0 cm or less not otherwise characterized in this report as processing suspicious or indeterminate imaging features is/are most likely to be benign and do not require follow-up imaging or biopsy.
Stable subcentimeter left hepatic hypodensity likely a cyst too small to characterize. The spleen is satisfactory in appearance. The gallbladder is not identified for certain. No calcified gallstones or dilated biliary radicle. The pancreas and adrenals are satisfactory. 1.5 cm exophytic left renal cyst. No solid renal mass. No ascites or enlarged abdominal lymph nodes. Minimal aortic atherosclerotic calcification. No small bowel obstruction. The appendix is not identified for certainty.
Pelvis:
Bilateral radiopaque tubal occlusion devices. The bladder is partially fluid-filled. No mass, free fluid, or lymphadenopathy.
Bone windows demonstrate moderate to advanced degenerative endplate changes and disc height loss L4-S1.
IMPRESSION: No acute intra-abdominal or intrapelvic abnormality.
All CT scans at this facility use dose modulation and/or weight based dosing when appropriate to reduce radiation dose to as low as reasonably achievable. (#[id number]#)

## 2024-05-02 IMAGING — CR XR CHEST 1 VIEW
1 series · 1 of 1 positions shown · non-contrast
Comparison: Chest radiograph January 20, 2025
COMPARISON: Chest radiograph January 20, 2025

------------- REPORT [id number] -------------
FINAL REPORT:
Chest portable one view
INDICATION: Chest pain

[AP]
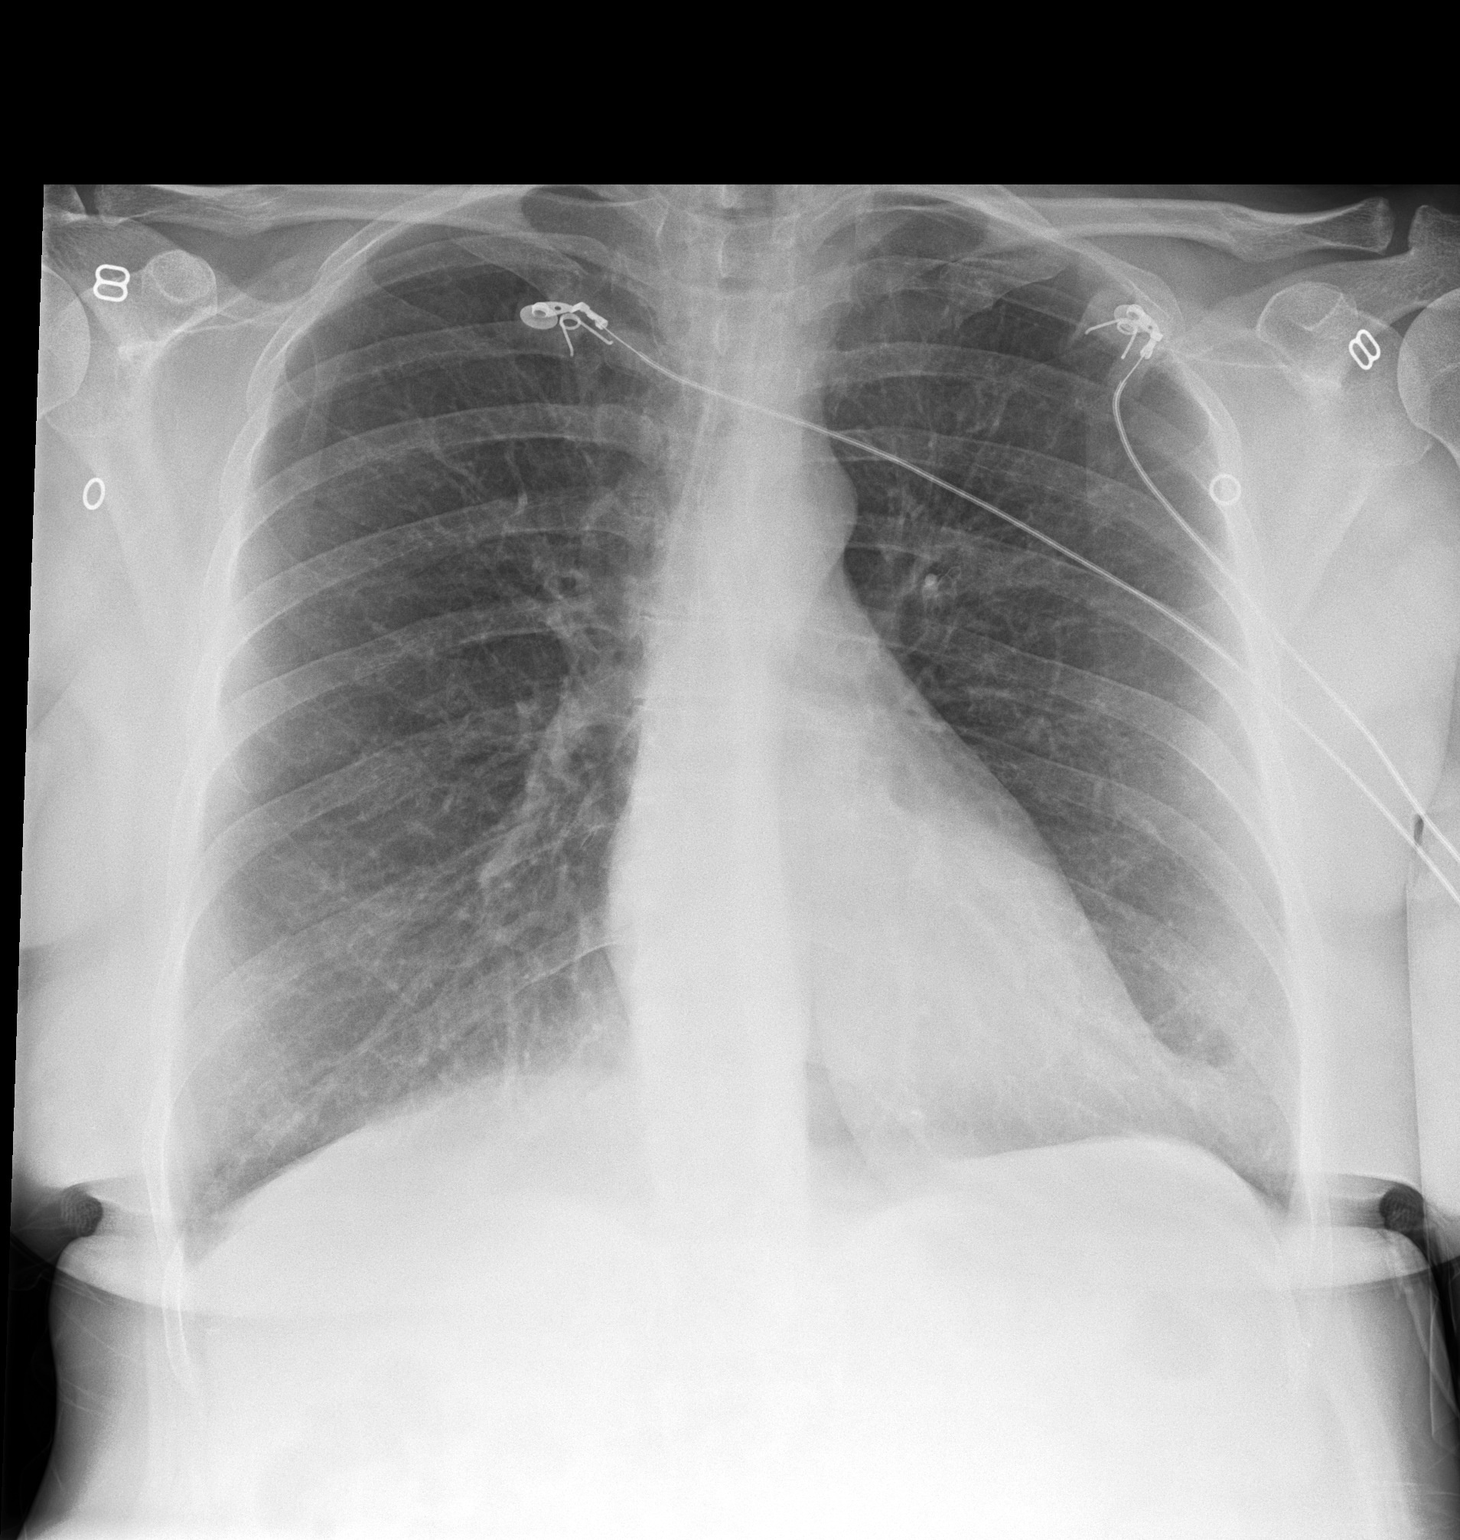

[1 of 1 positions shown; findings below may reference images not displayed]

FINDINGS: Mild asymmetric elevation of the right hemidiaphragm.
There is no consolidation, pleural effusion, or pneumothorax. The cardiac
silhouette is within normal limits. Dextroconvex curvature of the thoracic
spine. Degenerative changes of the spine.
IMPRESSION: 
IMPRESSION: No acute cardiopulmonary abnormality.
[DATE]
Is the patient pregnant?
No

------------- REPORT [id number] -------------
FINAL REPORT:
Chest portable one view
FINDINGS: Mild asymmetric elevation of the right hemidiaphragm.
There is no consolidation, pleural effusion, or pneumothorax. The cardiac
silhouette is within normal limits. Dextroconvex curvature of the thoracic
spine. Degenerative changes of the spine.
IMPRESSION: 
IMPRESSION: No acute cardiopulmonary abnormality.
[DATE]
Is the patient pregnant?
No

## 2024-05-23 IMAGING — DX XR CHEST 2 VIEWS
2 series · 2 of 2 positions shown · non-contrast
Comparison: 12/26/2025
COMPARISON: 12/26/2025

------------- REPORT [id number] -------------
FINAL REPORT:
INDICATION: Right sided chest pain

[PA]
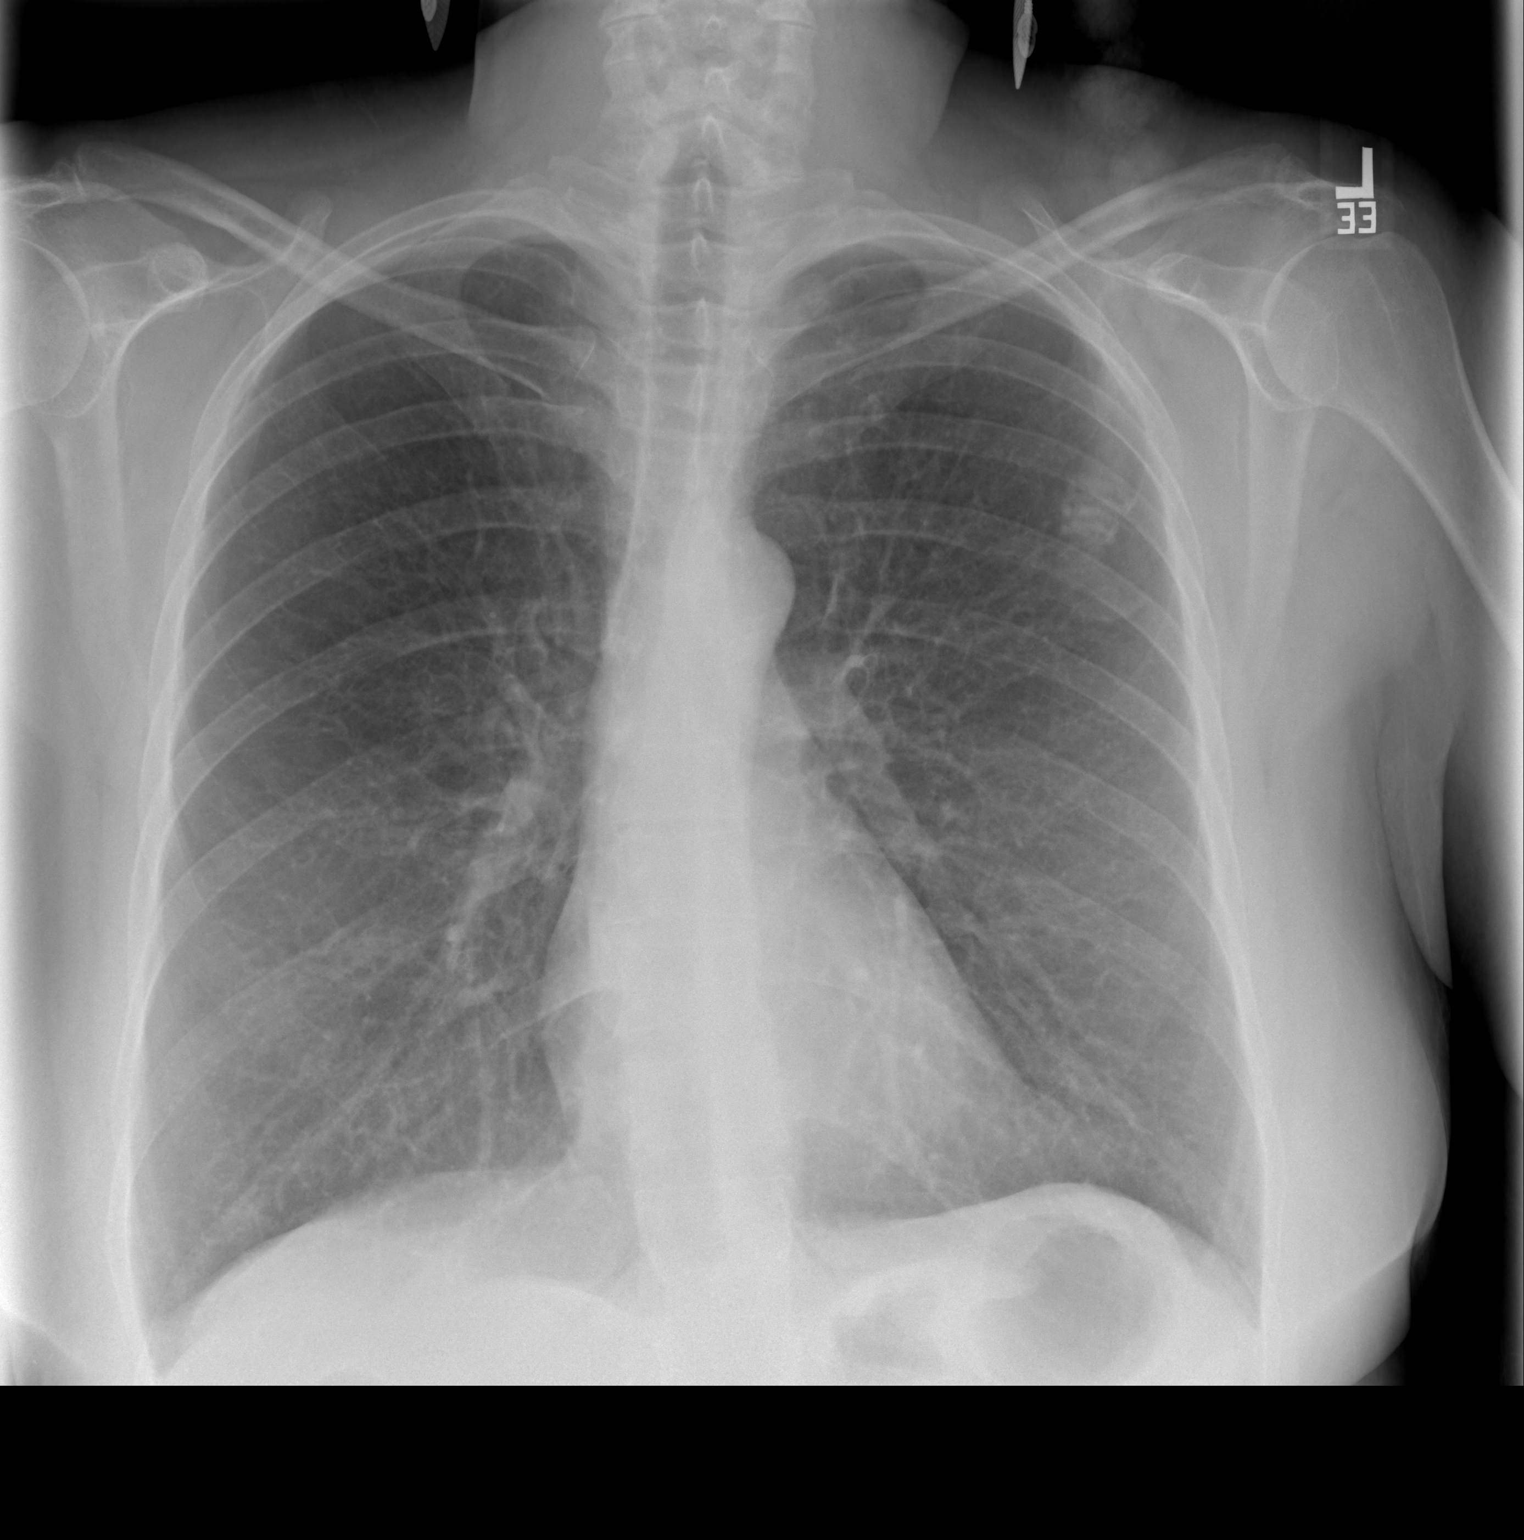

[left lateral]
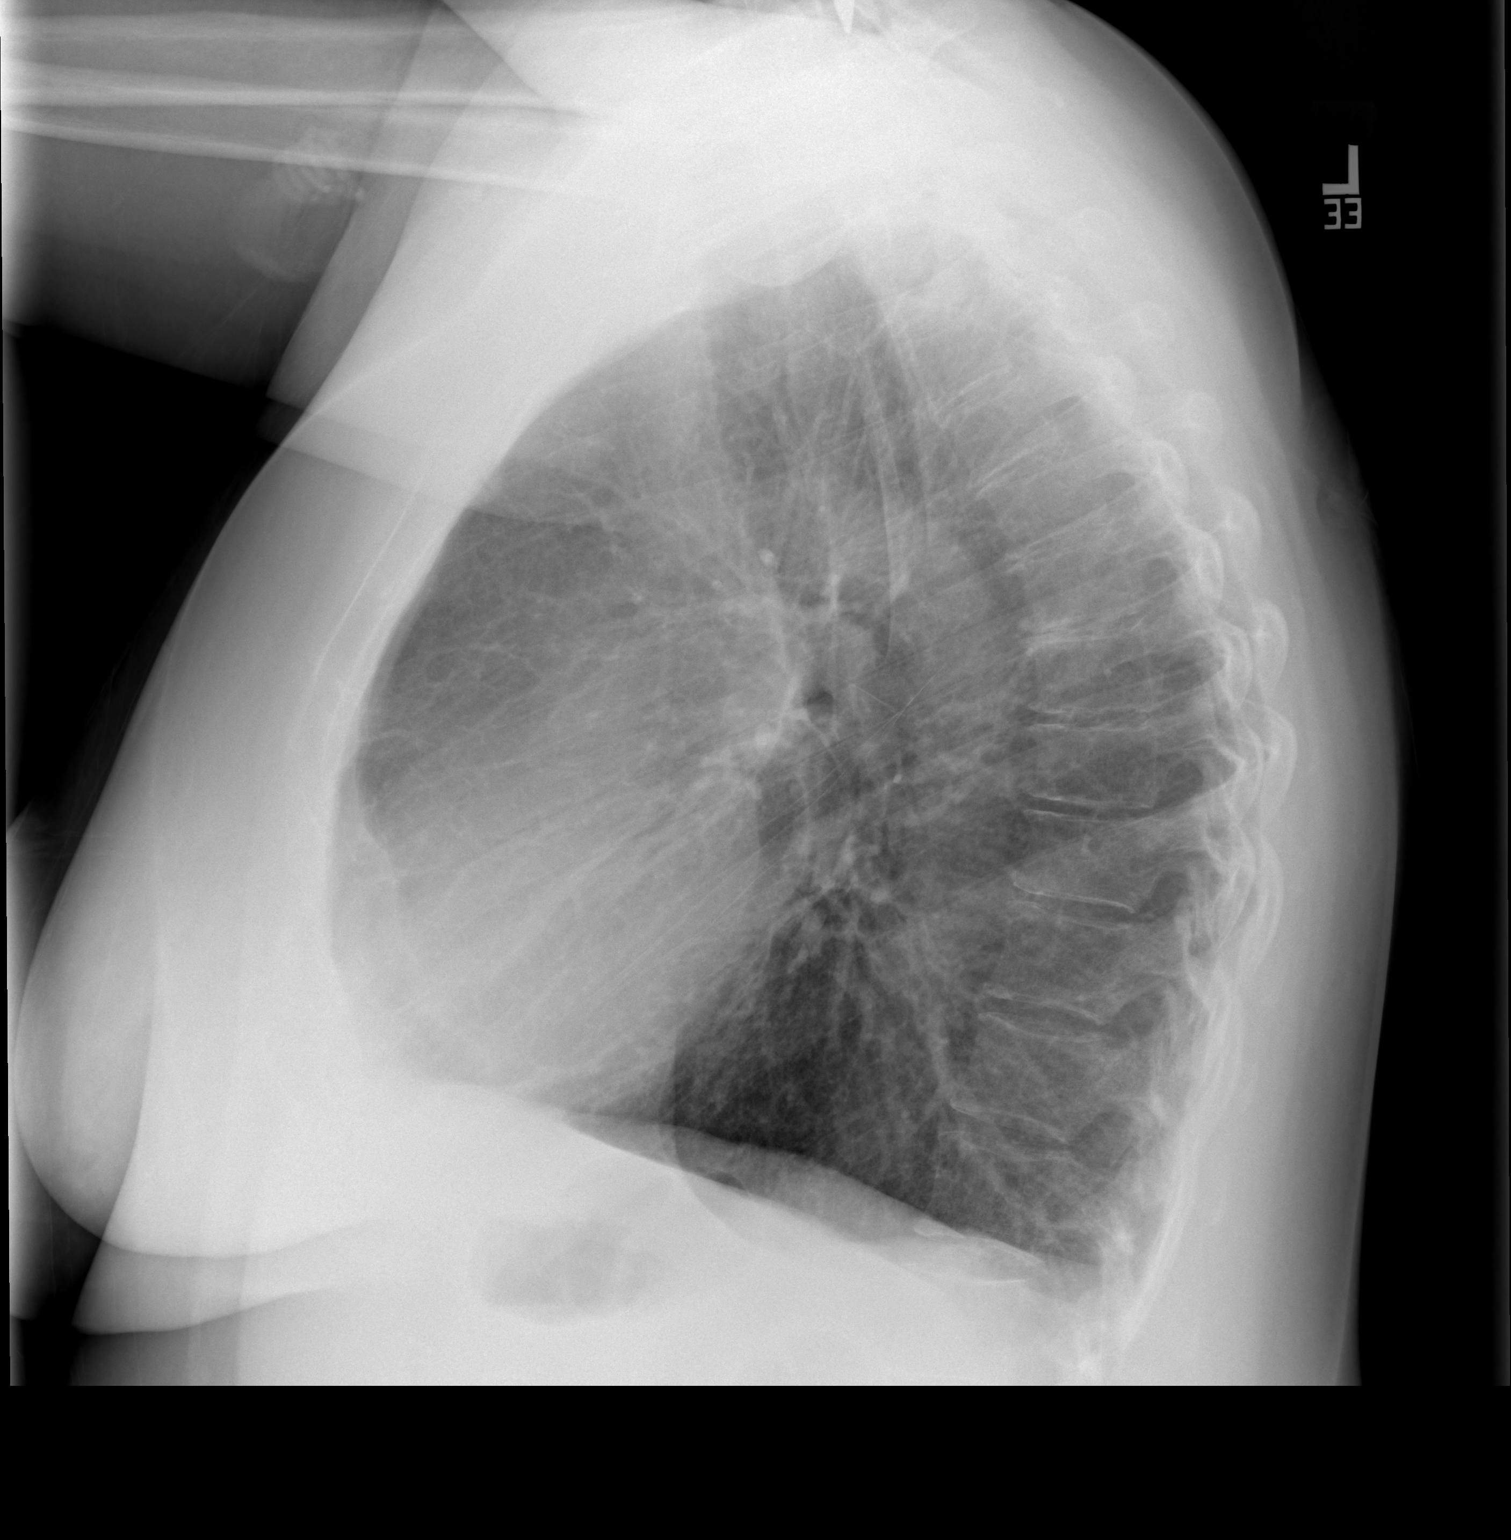

[2 of 2 positions shown; findings below may reference images not displayed]

FINDINGS: 2 views of the chest. The cardiomediastinal contours are within normal limits. No evidence of focal consolidation, pleural effusion, pulmonary edema, or pneumothorax.
IMPRESSION: 
IMPRESSION: No acute cardiopulmonary findings.
Is the patient pregnant?
No

------------- REPORT [id number] -------------
FINAL REPORT:
FINDINGS: 2 views of the chest. The cardiomediastinal contours are within normal limits. No evidence of focal consolidation, pleural effusion, pulmonary edema, or pneumothorax.
IMPRESSION: 
IMPRESSION: No acute cardiopulmonary findings.
Is the patient pregnant?
No

## 7622-05-28 DEATH — deceased
